# Patient Record
Sex: Female | Born: 1940 | Race: White | Hispanic: No | Marital: Married | Smoking: Never smoker
Health system: Southern US, Community
[De-identification: ages and names within clinical notes are randomized; demographics above are authoritative.]

## PROBLEM LIST (undated history)

## (undated) DIAGNOSIS — L299 Pruritus, unspecified: Secondary | ICD-10-CM

## (undated) DIAGNOSIS — E7800 Pure hypercholesterolemia, unspecified: Secondary | ICD-10-CM

## (undated) DIAGNOSIS — E039 Hypothyroidism, unspecified: Secondary | ICD-10-CM

## (undated) DIAGNOSIS — I10 Essential (primary) hypertension: Secondary | ICD-10-CM

## (undated) HISTORY — DX: Essential (primary) hypertension: I10

## (undated) HISTORY — PX: CATARACT EXTRACTION: SUR2

## (undated) HISTORY — DX: Pruritus, unspecified: L29.9

## (undated) HISTORY — PX: KNEE ARTHROSCOPY: SUR90

## (undated) HISTORY — DX: Pure hypercholesterolemia, unspecified: E78.00

## (undated) HISTORY — DX: Hypothyroidism, unspecified: E03.9

---

## 1998-07-23 ENCOUNTER — Other Ambulatory Visit: Admission: RE | Admit: 1998-07-23 | Discharge: 1998-07-23 | Payer: Self-pay | Admitting: Family Medicine

## 1999-09-30 ENCOUNTER — Other Ambulatory Visit: Admission: RE | Admit: 1999-09-30 | Discharge: 1999-09-30 | Payer: Self-pay | Admitting: Family Medicine

## 2001-02-03 ENCOUNTER — Other Ambulatory Visit: Admission: RE | Admit: 2001-02-03 | Discharge: 2001-02-03 | Payer: Self-pay | Admitting: Family Medicine

## 2002-11-15 ENCOUNTER — Other Ambulatory Visit: Admission: RE | Admit: 2002-11-15 | Discharge: 2002-11-15 | Payer: Self-pay | Admitting: Family Medicine

## 2004-10-22 ENCOUNTER — Other Ambulatory Visit: Admission: RE | Admit: 2004-10-22 | Discharge: 2004-10-22 | Payer: Self-pay | Admitting: Family Medicine

## 2004-10-22 ENCOUNTER — Ambulatory Visit: Payer: Self-pay | Admitting: Family Medicine

## 2004-11-27 ENCOUNTER — Ambulatory Visit: Payer: Self-pay | Admitting: Family Medicine

## 2016-10-06 ENCOUNTER — Ambulatory Visit (INDEPENDENT_AMBULATORY_CARE_PROVIDER_SITE_OTHER): Payer: Medicare Other | Admitting: Podiatry

## 2016-10-06 ENCOUNTER — Ambulatory Visit (INDEPENDENT_AMBULATORY_CARE_PROVIDER_SITE_OTHER): Payer: Medicare Other

## 2016-10-06 ENCOUNTER — Encounter (INDEPENDENT_AMBULATORY_CARE_PROVIDER_SITE_OTHER): Payer: Self-pay

## 2016-10-06 ENCOUNTER — Encounter: Payer: Self-pay | Admitting: Podiatry

## 2016-10-06 VITALS — BP 179/78 | HR 91 | Ht 64.0 in | Wt 176.0 lb

## 2016-10-06 DIAGNOSIS — M722 Plantar fascial fibromatosis: Secondary | ICD-10-CM

## 2016-10-06 MED ORDER — MELOXICAM 15 MG PO TABS: 15.0000 mg | ORAL_TABLET | Freq: Every day | ORAL | 0 refills | Status: DC

## 2016-10-06 NOTE — Progress Notes (Addendum)
   Subjective:    Patient ID: Kimberly Yu, female    DOB: [birth year], 76 y.o.   MRN: [id number] Chief Complaint  Patient presents with  . Foot Pain    left heel pain for 2 mo and a bump in the arch for 2 mo   . Callouses    callus under 3rd toe left     HPI  I have been having pain in my left heel and a lump in my arch for about 2 months.  Patient presents with above complaints. Reports L heel pain x 2 months. Has had this issue before and was treated by another provider. Also noticed some bumps/knots in the arch of her foot and states it increases and decreases in size but does not hurt. Has tried a night splint and plantar fascial strap without relief. Denies other issues.  Review of Systems  Genitourinary: Positive for frequency and urgency.  All other systems reviewed and are negative.     Objective:   Physical Exam  Vitals:   10/06/16 0919  BP: (!) 179/78  Pulse: 91   General AA&O x3. Normal mood and affect.  Vascular Dorsalis pedis and posterior tibial pulses  present 2+ bilaterally  Capillary refill normal to all digits. Pedal hair growth normal.  Neurologic Epicritic sensation grossly present bilaterally.  Dermatologic No open lesions. Interspaces clear of maceration. Nails well groomed and normal in appearance. Hyperkeratotic lesion plantar 3rd met left foot.  Orthopedic: MMT 5/5 in dorsiflexion, plantarflexion, inversion, and eversion bilaterally. Tender to palpation at the calcaneal tuber left. No pain with calcaneal squeeze left. Ankle ROM diminished range of motion left. Silfverskiold Test: positive left. Palpable approx 0.5 cm nodule along medial band of the plantar fascia without pain to palpation.   Radiographs: Taken and reviewed. No acute fractures. No evidence of calcaneal stress fracture.    Assessment & Plan:  Plantar Fasciitis, left - XR reviewed as above.  - Educated on icing and stretching. - Injection consisting of 1cc 0.5 % Marcaine  plain, delivered to the plantar fascia. - Patient reports she has both a night splint and a plantar fascial rest strap. - Educated on proper shoe gear.  Plantar Fibroma, left -Asymptomatic at this time -Will consider injection vs removal if they become symptomatic.  Evelina Bucy DPM

## 2016-11-03 ENCOUNTER — Ambulatory Visit (INDEPENDENT_AMBULATORY_CARE_PROVIDER_SITE_OTHER): Payer: Medicare Other | Admitting: Podiatry

## 2016-11-03 DIAGNOSIS — M722 Plantar fascial fibromatosis: Secondary | ICD-10-CM | POA: Diagnosis not present

## 2016-11-08 NOTE — Progress Notes (Signed)
   Subjective:    Patient ID: Kimberly Yu, female    DOB: [birth year], 76 y.o.   MRN: [id number]  HPI Chief Complaint  Patient presents with  . Plantar Fasciitis    left PF has eased some but not better  pain is bearable    76 y.o. female returns for the above complaint. Reports the pain is somewhat better. Had relief for a few days after her injection. Denies new issues.  Review of Systems     Objective:   Physical Exam  General AA&O x3. Normal mood and affect.  Vascular Dorsalis pedis and posterior tibial pulses  present 2+ bilaterally  Capillary refill normal to all digits. Pedal hair growth normal.  Neurologic Epicritic sensation grossly present bilaterally.  Dermatologic No open lesions. Interspaces clear of maceration. Nails well groomed and normal in appearance. Hyperkeratotic lesion plantar 3rd met left foot.  Orthopedic: MMT 5/5 in dorsiflexion, plantarflexion, inversion, and eversion bilaterally. Tender to palpation at the calcaneal tuber left. No pain with calcaneal squeeze left. Ankle ROM diminished range of motion left. Silfverskiold Test: positive left. Palpable approx 0.5 cm nodule along medial band of the plantar fascia without pain to palpation.     Assessment & Plan:   Plantar Fasciitis, L -Repeat injection today as below. -Continue night splint.  Procedure: Injection Tendon/Ligament Location: Left plantar fascia at the glabrous junction; medial approach. Skin Prep: Alcohol. Injectate: 1 cc 0.5% marcaine plain, 1 cc dexamethasone phosphate, 0.5 cc kenalog 10. Disposition: Patient tolerated procedure well. Injection site dressed with a band-aid.  F/u in 3 weeks.

## 2016-12-07 ENCOUNTER — Ambulatory Visit (INDEPENDENT_AMBULATORY_CARE_PROVIDER_SITE_OTHER): Payer: Medicare Other | Admitting: Allergy and Immunology

## 2016-12-07 ENCOUNTER — Encounter: Payer: Self-pay | Admitting: Allergy and Immunology

## 2016-12-07 VITALS — BP 160/90 | HR 72 | Temp 98.1°F | Resp 16 | Ht 63.6 in | Wt 175.6 lb

## 2016-12-07 DIAGNOSIS — L503 Dermatographic urticaria: Secondary | ICD-10-CM

## 2016-12-07 DIAGNOSIS — L299 Pruritus, unspecified: Secondary | ICD-10-CM

## 2016-12-07 NOTE — Patient Instructions (Addendum)
  1. Allergen avoidance measures?  2. Try loratadine 10 mg tablet 1-2 times per day  3. Can add Benadryl if needed  4. Blood - CBC w/diff, TP, sed, antimitochondrial antibody  5. Further evaluation? Yes, if uncontrolled or new symptoms develop  6. Contact clinic regarding loratadine response in 2 weeks

## 2016-12-07 NOTE — Progress Notes (Signed)
Dear Dr. Marcello Moores,  Thank you for referring Kimberly Yu to the [facility] on 12/07/2016.   Below is a summation of this patient's evaluation and recommendations.  Thank you for your referral. I will keep you informed about this patient's response to treatment.   If you have any questions please do not hesitate to contact me.   Sincerely,  Jiles Prows, MD Allergy / Immunology [facility]   ______________________________________________________________________    NEW PATIENT NOTE  Referring Provider: Melony Overly, MD   Primary Provider: Melony Overly, MD Date of office visit: 12/07/2016    Subjective:   Chief Complaint:  Kimberly Yu (DOB: 02-14-1940) is a 76 y.o. female who presents to the clinic on 12/07/2016 with a chief complaint of Pruritus .     HPI: Kimberly Yu presents this clinic in evaluation of pruritus.  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 She has no other associated systemic or constitutional symptoms. There has been no obvious provoking factor giving rise to this issue. She has not had a significant environmental change, is not using any new diets or supplements or health foods, is not on any new medications, and feels the same that she has felt over the course of the past year or so without any unusual developments. Screening blood tests performed by her primary care doctor has not identified any significant abnormality. She has been using Benadryl at nighttime which helps her somewhat and also uses over-the-counter hydrocortisone cream which may or may not help her.  Past Medical History:  Diagnosis Date  . High blood pressure   . High cholesterol   . Hypothyroidism   . Pruritus     Past Surgical History:    Procedure Laterality Date  . CATARACT EXTRACTION    . KNEE ARTHROSCOPY Right     Allergies as of 12/07/2016   No Known Allergies     Medication List      hydrochlorothiazide 25 MG tablet Commonly known as:  HYDRODIURIL Take 25 mg by mouth daily.   levothyroxine 50 MCG tablet Commonly known as:  SYNTHROID, LEVOTHROID Take 50 mcg by mouth daily before breakfast.   MULTIVITAMIN PO Take by mouth.   omeprazole 20 MG capsule Commonly known as:  PRILOSEC Take 20 mg by mouth daily.   pravastatin 40 MG tablet Commonly known as:  PRAVACHOL Take 40 mg by mouth daily.   ramipril 10 MG capsule Commonly known as:  ALTACE Take 10 mg by mouth daily.   Vitamin D3 1000 units Caps Take by mouth.       Review of systems negative except as noted in HPI / PMHx or noted below:  Review of Systems  Constitutional: Negative.   HENT: Negative.   Eyes: Negative.   Respiratory: Negative.   Cardiovascular: Negative.   Gastrointestinal: Negative.   Genitourinary: Negative.   Musculoskeletal: Negative.   Skin: Negative.   Neurological: Negative.   Endo/Heme/Allergies: Negative.   Psychiatric/Behavioral: Negative.     Family History  Problem Relation Age of Onset  . Stroke Mother   . Lung cancer Father   . Heart attack Father   . Diabetes Father   . Stroke Maternal Grandfather     Social History   Social History  . Marital status:  Married    Spouse name: N/A  . Number of children: N/A  . Years of education: N/A   Occupational History  . Not on file.   Social History Main Topics  . Smoking status: Never Smoker  . Smokeless tobacco: Never Used  . Alcohol use No  . Drug use: No  . Sexual activity: Not on file   Other Topics Concern  . Not on file   Social History Narrative  . No narrative on file    Environmental and Social history  Lives in a house with a dry environment, a dog located inside the household, no carpeting in the bedroom, plastic on the bed,  plastic on the pillow, and no smokers located inside the household.  Objective:   Vitals:   12/07/16 0856  BP: (!) 160/90  Pulse: 72  Resp: 16  Temp: 98.1 F (36.7 C)   Height: 5' 3.6" (161.5 cm) Weight: 175 lb 9.6 oz (79.7 kg)  Physical Exam  Constitutional: She is well-developed, well-nourished, and in no distress.  HENT:  Head: Normocephalic. Head is without right periorbital erythema and without left periorbital erythema.  Right Ear: Tympanic membrane, external ear and ear canal normal.  Left Ear: Tympanic membrane, external ear and ear canal normal.  Nose: Nose normal. No mucosal edema or rhinorrhea.  Mouth/Throat: Uvula is midline, oropharynx is clear and moist and mucous membranes are normal. No oropharyngeal exudate.  Eyes: Pupils are equal, round, and reactive to light. Conjunctivae and lids are normal.  Neck: Trachea normal. No tracheal tenderness present. No tracheal deviation present. No thyromegaly present.  Cardiovascular: Normal rate, regular rhythm, S1 normal, S2 normal and normal heart sounds.   No murmur heard. Pulmonary/Chest: Effort normal and breath sounds normal. No stridor. No tachypnea. No respiratory distress. She has no wheezes. She has no rales. She exhibits no tenderness.  Abdominal: Soft. She exhibits no distension and no mass. There is no hepatosplenomegaly. There is no tenderness. There is no rebound and no guarding.  Musculoskeletal: She exhibits no edema or tenderness.  Lymphadenopathy:       Head (right side): No tonsillar adenopathy present.       Head (left side): No tonsillar adenopathy present.    She has no cervical adenopathy.    She has no axillary adenopathy.  Neurological: She is alert. Gait normal.  Skin: No rash noted. She is not diaphoretic. No erythema. No pallor. Nails show no clubbing.  Psychiatric: Mood and affect normal.    Diagnostics: Allergy skin tests were performed. She did not demonstrate any hypersensitivity against a  screening panel of foods.  Results of blood tests forwarded by Dr. Marcello Moores dated 11/17/2016 refers to normal hepatic and renal function, normal TSH, normal urinalysis.  Assessment and Plan:    1. Pruritic disorder   2. Dermatographia     1. Allergen avoidance measures?  2. Try loratadine 10 mg tablet 1-2 times per day  3. Can add Benadryl if needed  4. Blood - CBC w/diff, TP, sed, antimitochondrial antibody  5. Further evaluation? Yes, if uncontrolled or new symptoms develop  6. 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 She will contact me in a few weeks noting her  response and I will contact her regarding her blood test results. Further evaluation and treatment will be based upon response to therapy and the results of her diagnostic testing.  Jiles Prows, MD Allergy / Immunology [facility]

## 2016-12-09 LAB — CBC WITH DIFFERENTIAL/PLATELET
Basophils Absolute: 0.1 10*3/uL (ref 0.0–0.2)
Basos: 1 %
EOS (ABSOLUTE): 0.1 10*3/uL (ref 0.0–0.4)
EOS: 2 %
Hematocrit: 38.2 % (ref 34.0–46.6)
Hemoglobin: 13.1 g/dL (ref 11.1–15.9)
IMMATURE GRANS (ABS): 0 10*3/uL (ref 0.0–0.1)
IMMATURE GRANULOCYTES: 0 %
LYMPHS: 26 %
Lymphocytes Absolute: 1.6 10*3/uL (ref 0.7–3.1)
MCH: 30.5 pg (ref 26.6–33.0)
MCHC: 34.3 g/dL (ref 31.5–35.7)
MCV: 89 fL (ref 79–97)
Monocytes Absolute: 0.4 10*3/uL (ref 0.1–0.9)
Monocytes: 6 %
NEUTROS PCT: 65 %
Neutrophils Absolute: 4.2 10*3/uL (ref 1.4–7.0)
PLATELETS: 380 10*3/uL — AB (ref 150–379)
RBC: 4.29 x10E6/uL (ref 3.77–5.28)
RDW: 13.2 % (ref 12.3–15.4)
WBC: 6.4 10*3/uL (ref 3.4–10.8)

## 2016-12-09 LAB — SEDIMENTATION RATE: SED RATE: 14 mm/h (ref 0–40)

## 2016-12-09 LAB — MITOCHONDRIAL ANTIBODIES: MITOCHONDRIAL AB: 3.9 U (ref 0.0–20.0)

## 2016-12-09 LAB — THYROID PEROXIDASE ANTIBODY: Thyroperoxidase Ab SerPl-aCnc: 9 IU/mL (ref 0–34)

## 2016-12-22 ENCOUNTER — Ambulatory Visit: Payer: Medicare Other | Admitting: Podiatry

## 2017-09-07 ENCOUNTER — Other Ambulatory Visit: Payer: Self-pay | Admitting: Neurosurgery

## 2017-09-07 DIAGNOSIS — M5412 Radiculopathy, cervical region: Secondary | ICD-10-CM

## 2017-09-13 ENCOUNTER — Ambulatory Visit
Admission: RE | Admit: 2017-09-13 | Discharge: 2017-09-13 | Disposition: A | Discharge status: Home / Self Care | Payer: Medicare Other | Source: Ambulatory Visit | Attending: Neurosurgery | Admitting: Neurosurgery

## 2017-09-13 DIAGNOSIS — M5412 Radiculopathy, cervical region: Secondary | ICD-10-CM

## 2018-07-29 ENCOUNTER — Other Ambulatory Visit: Payer: Self-pay | Admitting: Internal Medicine

## 2018-07-29 DIAGNOSIS — R921 Mammographic calcification found on diagnostic imaging of breast: Secondary | ICD-10-CM

## 2018-07-29 DIAGNOSIS — N63 Unspecified lump in unspecified breast: Secondary | ICD-10-CM

## 2018-08-02 ENCOUNTER — Other Ambulatory Visit: Payer: Self-pay | Admitting: Internal Medicine

## 2018-08-02 DIAGNOSIS — R921 Mammographic calcification found on diagnostic imaging of breast: Secondary | ICD-10-CM

## 2018-08-05 ENCOUNTER — Ambulatory Visit
Admission: RE | Admit: 2018-08-05 | Discharge: 2018-08-05 | Disposition: A | Discharge status: Home / Self Care | Payer: Medicare Other | Source: Ambulatory Visit | Attending: Internal Medicine | Admitting: Internal Medicine

## 2018-08-05 ENCOUNTER — Other Ambulatory Visit: Payer: Self-pay

## 2018-08-05 DIAGNOSIS — N63 Unspecified lump in unspecified breast: Secondary | ICD-10-CM

## 2018-08-05 DIAGNOSIS — R921 Mammographic calcification found on diagnostic imaging of breast: Secondary | ICD-10-CM

## 2018-08-16 DIAGNOSIS — Z170 Estrogen receptor positive status [ER+]: Secondary | ICD-10-CM | POA: Diagnosis not present

## 2018-08-16 DIAGNOSIS — C50412 Malignant neoplasm of upper-outer quadrant of left female breast: Secondary | ICD-10-CM | POA: Diagnosis not present

## 2018-09-12 DIAGNOSIS — C50412 Malignant neoplasm of upper-outer quadrant of left female breast: Secondary | ICD-10-CM | POA: Diagnosis not present

## 2018-09-14 DIAGNOSIS — I351 Nonrheumatic aortic (valve) insufficiency: Secondary | ICD-10-CM

## 2018-09-14 DIAGNOSIS — I340 Nonrheumatic mitral (valve) insufficiency: Secondary | ICD-10-CM

## 2018-10-06 DIAGNOSIS — C50412 Malignant neoplasm of upper-outer quadrant of left female breast: Secondary | ICD-10-CM | POA: Diagnosis not present

## 2018-10-27 DIAGNOSIS — C50412 Malignant neoplasm of upper-outer quadrant of left female breast: Secondary | ICD-10-CM | POA: Diagnosis not present

## 2018-11-03 ENCOUNTER — Other Ambulatory Visit (HOSPITAL_COMMUNITY): Payer: Self-pay | Admitting: Oncology

## 2018-11-03 DIAGNOSIS — Z09 Encounter for follow-up examination after completed treatment for conditions other than malignant neoplasm: Secondary | ICD-10-CM

## 2018-11-11 DIAGNOSIS — D649 Anemia, unspecified: Secondary | ICD-10-CM

## 2018-11-11 DIAGNOSIS — R7881 Bacteremia: Secondary | ICD-10-CM

## 2018-11-11 DIAGNOSIS — A419 Sepsis, unspecified organism: Secondary | ICD-10-CM

## 2018-11-11 DIAGNOSIS — I10 Essential (primary) hypertension: Secondary | ICD-10-CM

## 2018-11-11 DIAGNOSIS — N390 Urinary tract infection, site not specified: Secondary | ICD-10-CM | POA: Diagnosis not present

## 2018-11-11 DIAGNOSIS — E785 Hyperlipidemia, unspecified: Secondary | ICD-10-CM

## 2018-11-12 DIAGNOSIS — R7881 Bacteremia: Secondary | ICD-10-CM | POA: Diagnosis not present

## 2018-11-12 DIAGNOSIS — D649 Anemia, unspecified: Secondary | ICD-10-CM | POA: Diagnosis not present

## 2018-11-12 DIAGNOSIS — A419 Sepsis, unspecified organism: Secondary | ICD-10-CM | POA: Diagnosis not present

## 2018-11-12 DIAGNOSIS — N390 Urinary tract infection, site not specified: Secondary | ICD-10-CM | POA: Diagnosis not present

## 2018-11-13 DIAGNOSIS — D649 Anemia, unspecified: Secondary | ICD-10-CM | POA: Diagnosis not present

## 2018-11-13 DIAGNOSIS — N390 Urinary tract infection, site not specified: Secondary | ICD-10-CM | POA: Diagnosis not present

## 2018-11-13 DIAGNOSIS — R7881 Bacteremia: Secondary | ICD-10-CM | POA: Diagnosis not present

## 2018-11-13 DIAGNOSIS — A419 Sepsis, unspecified organism: Secondary | ICD-10-CM | POA: Diagnosis not present

## 2018-11-14 DIAGNOSIS — R7881 Bacteremia: Secondary | ICD-10-CM | POA: Diagnosis not present

## 2018-11-14 DIAGNOSIS — N390 Urinary tract infection, site not specified: Secondary | ICD-10-CM | POA: Diagnosis not present

## 2018-11-14 DIAGNOSIS — D649 Anemia, unspecified: Secondary | ICD-10-CM | POA: Diagnosis not present

## 2018-11-14 DIAGNOSIS — A419 Sepsis, unspecified organism: Secondary | ICD-10-CM | POA: Diagnosis not present

## 2018-11-24 DIAGNOSIS — Z170 Estrogen receptor positive status [ER+]: Secondary | ICD-10-CM | POA: Diagnosis not present

## 2018-11-24 DIAGNOSIS — C50412 Malignant neoplasm of upper-outer quadrant of left female breast: Secondary | ICD-10-CM

## 2018-12-07 ENCOUNTER — Other Ambulatory Visit (HOSPITAL_COMMUNITY): Payer: Medicare Other

## 2018-12-22 DIAGNOSIS — C50412 Malignant neoplasm of upper-outer quadrant of left female breast: Secondary | ICD-10-CM | POA: Diagnosis not present

## 2018-12-22 DIAGNOSIS — Z170 Estrogen receptor positive status [ER+]: Secondary | ICD-10-CM | POA: Diagnosis not present

## 2019-04-06 DIAGNOSIS — Z170 Estrogen receptor positive status [ER+]: Secondary | ICD-10-CM | POA: Diagnosis not present

## 2019-04-06 DIAGNOSIS — Z79810 Long term (current) use of selective estrogen receptor modulators (SERMs): Secondary | ICD-10-CM | POA: Diagnosis not present

## 2019-04-06 DIAGNOSIS — C50919 Malignant neoplasm of unspecified site of unspecified female breast: Secondary | ICD-10-CM

## 2019-06-28 DIAGNOSIS — C50412 Malignant neoplasm of upper-outer quadrant of left female breast: Secondary | ICD-10-CM | POA: Diagnosis not present

## 2019-06-28 DIAGNOSIS — I361 Nonrheumatic tricuspid (valve) insufficiency: Secondary | ICD-10-CM | POA: Diagnosis not present

## 2019-06-28 DIAGNOSIS — I351 Nonrheumatic aortic (valve) insufficiency: Secondary | ICD-10-CM | POA: Diagnosis not present

## 2019-06-28 DIAGNOSIS — I340 Nonrheumatic mitral (valve) insufficiency: Secondary | ICD-10-CM | POA: Diagnosis not present

## 2019-06-29 DIAGNOSIS — C50412 Malignant neoplasm of upper-outer quadrant of left female breast: Secondary | ICD-10-CM | POA: Diagnosis not present

## 2019-07-03 DIAGNOSIS — C50412 Malignant neoplasm of upper-outer quadrant of left female breast: Secondary | ICD-10-CM | POA: Diagnosis not present

## 2019-07-03 DIAGNOSIS — Z853 Personal history of malignant neoplasm of breast: Secondary | ICD-10-CM | POA: Diagnosis not present

## 2019-07-03 DIAGNOSIS — R928 Other abnormal and inconclusive findings on diagnostic imaging of breast: Secondary | ICD-10-CM | POA: Diagnosis not present

## 2019-07-07 DIAGNOSIS — C50412 Malignant neoplasm of upper-outer quadrant of left female breast: Secondary | ICD-10-CM | POA: Diagnosis not present

## 2019-07-07 DIAGNOSIS — Z170 Estrogen receptor positive status [ER+]: Secondary | ICD-10-CM | POA: Diagnosis not present

## 2019-07-20 DIAGNOSIS — C50412 Malignant neoplasm of upper-outer quadrant of left female breast: Secondary | ICD-10-CM | POA: Diagnosis not present

## 2019-07-27 DIAGNOSIS — Z9181 History of falling: Secondary | ICD-10-CM | POA: Diagnosis not present

## 2019-07-27 DIAGNOSIS — Z1331 Encounter for screening for depression: Secondary | ICD-10-CM | POA: Diagnosis not present

## 2019-07-27 DIAGNOSIS — Z0000 Encounter for general adult medical examination without abnormal findings: Secondary | ICD-10-CM | POA: Diagnosis not present

## 2019-07-27 DIAGNOSIS — E785 Hyperlipidemia, unspecified: Secondary | ICD-10-CM | POA: Diagnosis not present

## 2019-07-27 DIAGNOSIS — Z139 Encounter for screening, unspecified: Secondary | ICD-10-CM | POA: Diagnosis not present

## 2019-08-10 DIAGNOSIS — Z5111 Encounter for antineoplastic chemotherapy: Secondary | ICD-10-CM | POA: Diagnosis not present

## 2019-08-10 DIAGNOSIS — C50412 Malignant neoplasm of upper-outer quadrant of left female breast: Secondary | ICD-10-CM | POA: Diagnosis not present

## 2019-08-31 DIAGNOSIS — Z5111 Encounter for antineoplastic chemotherapy: Secondary | ICD-10-CM | POA: Diagnosis not present

## 2019-08-31 DIAGNOSIS — C50412 Malignant neoplasm of upper-outer quadrant of left female breast: Secondary | ICD-10-CM | POA: Diagnosis not present

## 2019-09-04 DIAGNOSIS — I351 Nonrheumatic aortic (valve) insufficiency: Secondary | ICD-10-CM | POA: Diagnosis not present

## 2019-09-04 DIAGNOSIS — E039 Hypothyroidism, unspecified: Secondary | ICD-10-CM | POA: Diagnosis not present

## 2019-09-04 DIAGNOSIS — E785 Hyperlipidemia, unspecified: Secondary | ICD-10-CM | POA: Diagnosis not present

## 2019-09-04 DIAGNOSIS — E1122 Type 2 diabetes mellitus with diabetic chronic kidney disease: Secondary | ICD-10-CM | POA: Diagnosis not present

## 2019-09-04 DIAGNOSIS — M791 Myalgia, unspecified site: Secondary | ICD-10-CM | POA: Diagnosis not present

## 2019-09-04 DIAGNOSIS — G629 Polyneuropathy, unspecified: Secondary | ICD-10-CM | POA: Diagnosis not present

## 2019-09-04 DIAGNOSIS — Z6828 Body mass index (BMI) 28.0-28.9, adult: Secondary | ICD-10-CM | POA: Diagnosis not present

## 2019-09-04 DIAGNOSIS — I10 Essential (primary) hypertension: Secondary | ICD-10-CM | POA: Diagnosis not present

## 2019-09-04 DIAGNOSIS — N182 Chronic kidney disease, stage 2 (mild): Secondary | ICD-10-CM | POA: Diagnosis not present

## 2019-09-04 DIAGNOSIS — K219 Gastro-esophageal reflux disease without esophagitis: Secondary | ICD-10-CM | POA: Diagnosis not present

## 2019-09-06 DIAGNOSIS — H353131 Nonexudative age-related macular degeneration, bilateral, early dry stage: Secondary | ICD-10-CM | POA: Diagnosis not present

## 2019-09-25 DIAGNOSIS — M1712 Unilateral primary osteoarthritis, left knee: Secondary | ICD-10-CM | POA: Diagnosis not present

## 2019-09-25 DIAGNOSIS — M25562 Pain in left knee: Secondary | ICD-10-CM | POA: Diagnosis not present

## 2019-09-25 DIAGNOSIS — M25561 Pain in right knee: Secondary | ICD-10-CM | POA: Diagnosis not present

## 2019-10-02 ENCOUNTER — Ambulatory Visit: Payer: Medicare Other | Admitting: Podiatry

## 2019-10-05 DIAGNOSIS — H9203 Otalgia, bilateral: Secondary | ICD-10-CM | POA: Diagnosis not present

## 2019-10-24 DIAGNOSIS — M1711 Unilateral primary osteoarthritis, right knee: Secondary | ICD-10-CM | POA: Diagnosis not present

## 2019-10-25 DIAGNOSIS — M25562 Pain in left knee: Secondary | ICD-10-CM | POA: Diagnosis not present

## 2019-10-26 DIAGNOSIS — H9319 Tinnitus, unspecified ear: Secondary | ICD-10-CM | POA: Diagnosis not present

## 2019-10-26 DIAGNOSIS — J342 Deviated nasal septum: Secondary | ICD-10-CM | POA: Diagnosis not present

## 2019-10-26 DIAGNOSIS — H9203 Otalgia, bilateral: Secondary | ICD-10-CM | POA: Diagnosis not present

## 2019-10-26 DIAGNOSIS — H9190 Unspecified hearing loss, unspecified ear: Secondary | ICD-10-CM | POA: Diagnosis not present

## 2019-10-26 DIAGNOSIS — J343 Hypertrophy of nasal turbinates: Secondary | ICD-10-CM | POA: Diagnosis not present

## 2019-11-01 DIAGNOSIS — S82132A Displaced fracture of medial condyle of left tibia, initial encounter for closed fracture: Secondary | ICD-10-CM | POA: Diagnosis not present

## 2019-11-01 DIAGNOSIS — M1712 Unilateral primary osteoarthritis, left knee: Secondary | ICD-10-CM | POA: Diagnosis not present

## 2019-11-01 DIAGNOSIS — S83242S Other tear of medial meniscus, current injury, left knee, sequela: Secondary | ICD-10-CM | POA: Diagnosis not present

## 2019-11-09 DIAGNOSIS — J342 Deviated nasal septum: Secondary | ICD-10-CM | POA: Diagnosis not present

## 2019-11-09 DIAGNOSIS — H9319 Tinnitus, unspecified ear: Secondary | ICD-10-CM | POA: Diagnosis not present

## 2019-11-09 DIAGNOSIS — H903 Sensorineural hearing loss, bilateral: Secondary | ICD-10-CM | POA: Diagnosis not present

## 2019-12-19 ENCOUNTER — Other Ambulatory Visit: Payer: Self-pay | Admitting: Oncology

## 2019-12-20 ENCOUNTER — Telehealth: Payer: Self-pay

## 2019-12-20 ENCOUNTER — Other Ambulatory Visit: Payer: Self-pay | Admitting: Oncology

## 2019-12-20 MED ORDER — ANASTROZOLE 1 MG PO TABS: 1.0000 mg | ORAL_TABLET | Freq: Every day | ORAL | 3 refills | Status: DC

## 2019-12-21 NOTE — Telephone Encounter (Signed)
MD. REFILLED MEDICATION

## 2019-12-31 NOTE — Progress Notes (Signed)
Kimberly Yu  [address] [place],  [place]  [address] [phone]  Clinic Day:  01/01/2020  Referring physician: Nicoletta Dress, MD   HISTORY OF PRESENT ILLNESS:  The patient is a 79 y.o. 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  She recently completed her year-long maintenance Herceptin.  This was preceded by weekly paclitaxel/Herceptin.  The patient claims her neuropathy has gotten much better over the past few months, which she attributes to taking vitamin D.  She takes anastrazole for her adjuvant endocrine endocrine therapy.   She comes in today for routine follow-up.  Since her last visit, the patient has been doing well.  She denies having any particular changes in her breasts which concern her for early disease recurrence.     PHYSICAL EXAM:  Blood pressure (!) 174/77, pulse 88, temperature 98.1 F (36.7 C), resp. rate 14, height 5\' 3"  (1.6 m), weight 163 lb 1.6 oz (74 kg), SpO2 95 %. Wt Readings from Last 3 Encounters:  01/01/20 163 lb 1.6 oz (74 kg)  12/07/16 175 lb 9.6 oz (79.7 kg)  10/06/16 176 lb (79.8 kg)   Body mass index is 28.89 kg/m. Performance status (ECOG): 1 Physical Exam Constitutional:      Appearance: Normal appearance.  HENT:     Mouth/Throat:     Pharynx: Oropharynx is clear. No oropharyngeal exudate.  Cardiovascular:     Rate and Rhythm: Normal rate and regular rhythm.     Heart sounds: No murmur heard.  No friction rub. No gallop.   Pulmonary:     Breath sounds: Normal breath sounds.  Chest:     Breasts:        Right: No swelling, bleeding, inverted nipple, mass, nipple discharge or skin change.        Left: No swelling, bleeding, inverted nipple, mass, nipple discharge or skin change.  Abdominal:     General: Bowel sounds are normal. There is no distension.     Palpations: Abdomen is soft. There is no mass.     Tenderness: There is no abdominal tenderness.   Musculoskeletal:        General: No tenderness.     Cervical back: Normal range of motion and neck supple.     Right lower leg: No edema.     Left lower leg: No edema.  Lymphadenopathy:     Cervical: No cervical adenopathy.     Right cervical: No superficial, deep or posterior cervical adenopathy.    Left cervical: No superficial, deep or posterior cervical adenopathy.     Upper Body:     Right upper body: No supraclavicular or axillary adenopathy.     Left upper body: No supraclavicular or axillary adenopathy.     Lower Body: No right inguinal adenopathy. No left inguinal adenopathy.  Skin:    Coloration: Skin is not jaundiced.     Findings: No lesion or rash.  Neurological:     General: No focal deficit present.     Mental Status: She is alert and oriented to person, place, and time. Mental status is at baseline.  Psychiatric:        Mood and Affect: Mood normal.        Behavior: Behavior normal.        Thought Content: Thought content normal.        Judgment: Judgment normal.     ASSESSMENT & PLAN:   Assessment/Plan:  A  79 y.o. 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  Based upon her clinical breast exam today, the patient remains disease-free.  She knows to continue taking anastrozole 1 mg daily to complete 5 years of adjuvant endocrine therapy.  Clinically, the patient appears to be doing very well.  I will see this patient back in 4 months for a repeat clinical breast exam.  The patient understands all the plans discussed today and is in agreement with them.      Atha Mcbain Macarthur Critchley, MD

## 2020-01-01 ENCOUNTER — Telehealth: Payer: Self-pay | Admitting: Oncology

## 2020-01-01 ENCOUNTER — Other Ambulatory Visit: Payer: Self-pay

## 2020-01-01 ENCOUNTER — Inpatient Hospital Stay: Payer: Medicare PPO | Attending: Oncology | Admitting: Oncology

## 2020-01-01 VITALS — BP 174/77 | HR 88 | Temp 98.1°F | Resp 14 | Ht 63.0 in | Wt 163.1 lb

## 2020-01-01 DIAGNOSIS — C50212 Malignant neoplasm of upper-inner quadrant of left female breast: Secondary | ICD-10-CM

## 2020-01-01 DIAGNOSIS — Z170 Estrogen receptor positive status [ER+]: Secondary | ICD-10-CM | POA: Diagnosis not present

## 2020-01-01 NOTE — Telephone Encounter (Signed)
Per 11/22 LOS, scheduled patient for 04/30/20 Follow up with Dr Bobby Rumpf [facility] patient Appt Summary

## 2020-01-05 DIAGNOSIS — K219 Gastro-esophageal reflux disease without esophagitis: Secondary | ICD-10-CM | POA: Diagnosis not present

## 2020-01-05 DIAGNOSIS — E1122 Type 2 diabetes mellitus with diabetic chronic kidney disease: Secondary | ICD-10-CM | POA: Diagnosis not present

## 2020-01-05 DIAGNOSIS — I10 Essential (primary) hypertension: Secondary | ICD-10-CM | POA: Diagnosis not present

## 2020-01-05 DIAGNOSIS — E785 Hyperlipidemia, unspecified: Secondary | ICD-10-CM | POA: Diagnosis not present

## 2020-01-05 DIAGNOSIS — I351 Nonrheumatic aortic (valve) insufficiency: Secondary | ICD-10-CM | POA: Diagnosis not present

## 2020-01-05 DIAGNOSIS — N182 Chronic kidney disease, stage 2 (mild): Secondary | ICD-10-CM | POA: Diagnosis not present

## 2020-01-05 DIAGNOSIS — M791 Myalgia, unspecified site: Secondary | ICD-10-CM | POA: Diagnosis not present

## 2020-01-05 DIAGNOSIS — E039 Hypothyroidism, unspecified: Secondary | ICD-10-CM | POA: Diagnosis not present

## 2020-01-05 DIAGNOSIS — Z853 Personal history of malignant neoplasm of breast: Secondary | ICD-10-CM | POA: Diagnosis not present

## 2020-03-05 DIAGNOSIS — M546 Pain in thoracic spine: Secondary | ICD-10-CM | POA: Diagnosis not present

## 2020-04-08 DIAGNOSIS — Z9109 Other allergy status, other than to drugs and biological substances: Secondary | ICD-10-CM | POA: Diagnosis not present

## 2020-04-08 DIAGNOSIS — E785 Hyperlipidemia, unspecified: Secondary | ICD-10-CM | POA: Diagnosis not present

## 2020-04-08 DIAGNOSIS — E039 Hypothyroidism, unspecified: Secondary | ICD-10-CM | POA: Diagnosis not present

## 2020-04-08 DIAGNOSIS — N182 Chronic kidney disease, stage 2 (mild): Secondary | ICD-10-CM | POA: Diagnosis not present

## 2020-04-08 DIAGNOSIS — K219 Gastro-esophageal reflux disease without esophagitis: Secondary | ICD-10-CM | POA: Diagnosis not present

## 2020-04-08 DIAGNOSIS — Z853 Personal history of malignant neoplasm of breast: Secondary | ICD-10-CM | POA: Diagnosis not present

## 2020-04-08 DIAGNOSIS — I10 Essential (primary) hypertension: Secondary | ICD-10-CM | POA: Diagnosis not present

## 2020-04-08 DIAGNOSIS — I351 Nonrheumatic aortic (valve) insufficiency: Secondary | ICD-10-CM | POA: Diagnosis not present

## 2020-04-08 DIAGNOSIS — E1122 Type 2 diabetes mellitus with diabetic chronic kidney disease: Secondary | ICD-10-CM | POA: Diagnosis not present

## 2020-04-22 DIAGNOSIS — C50412 Malignant neoplasm of upper-outer quadrant of left female breast: Secondary | ICD-10-CM | POA: Insufficient documentation

## 2020-04-29 NOTE — Progress Notes (Signed)
[facility]  [address] [place],  [place]  [address] [phone]  Clinic Day:  04/30/2020  Referring physician: Nicoletta Dress, MD   HISTORY OF PRESENT ILLNESS:  The patient is a 80 y.o. 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  She completed her adjuvant chemotherapy, which consisted of weekly paclitaxel/Herceptin, followed by year-long maintenance Herceptin.  She continues to take anastrazole for her adjuvant endocrine endocrine therapy.   She comes in today for routine follow-up.  Since her last visit, the patient has been doing well.  She denies having any particular changes in her breasts which concern her for early disease recurrence.    PHYSICAL EXAM:  Blood pressure (!) 182/79, pulse 75, temperature 98.3 F (36.8 C), resp. rate 16, height 5\' 3"  (1.6 m), weight 168 lb 8 oz (76.4 kg), SpO2 97 %. Wt Readings from Last 3 Encounters:  04/30/20 168 lb 8 oz (76.4 kg)  01/01/20 163 lb 1.6 oz (74 kg)  12/07/16 175 lb 9.6 oz (79.7 kg)   Body mass index is 29.85 kg/m. Performance status (ECOG): 1 Physical Exam Constitutional:      Appearance: Normal appearance.  HENT:     Mouth/Throat:     Pharynx: Oropharynx is clear. No oropharyngeal exudate.  Cardiovascular:     Rate and Rhythm: Normal rate and regular rhythm.     Heart sounds: No murmur heard. No friction rub. No gallop.   Pulmonary:     Breath sounds: Normal breath sounds.  Chest:  Breasts:     Right: No swelling, bleeding, inverted nipple, mass, nipple discharge, skin change, axillary adenopathy or supraclavicular adenopathy.     Left: No swelling, bleeding, inverted nipple, mass, nipple discharge, skin change, axillary adenopathy or supraclavicular adenopathy.    Abdominal:     General: Bowel sounds are normal. There is no distension.     Palpations: Abdomen is soft. There is no mass.      Tenderness: There is no abdominal tenderness.  Musculoskeletal:        General: No tenderness.     Cervical back: Normal range of motion and neck supple.     Right lower leg: No edema.     Left lower leg: No edema.  Lymphadenopathy:     Cervical: No cervical adenopathy.     Right cervical: No superficial, deep or posterior cervical adenopathy.    Left cervical: No superficial, deep or posterior cervical adenopathy.     Upper Body:     Right upper body: No supraclavicular or axillary adenopathy.     Left upper body: No supraclavicular or axillary adenopathy.     Lower Body: No right inguinal adenopathy. No left inguinal adenopathy.  Skin:    Coloration: Skin is not jaundiced.     Findings: No lesion or rash.  Neurological:     General: No focal deficit present.     Mental Status: She is alert and oriented to person, place, and time. Mental status is at baseline.  Psychiatric:        Mood and Affect: Mood normal.        Behavior: Behavior normal.        Thought Content: Thought content normal.        Judgment: Judgment normal.     ASSESSMENT & PLAN:  Assessment/Plan:  A 80 y.o. female with stage IA (T1b N0 M0) hormone/her  2 Neu receptor positive breast cancer.  Based upon her clinical breast exam today, the patient remains disease-free.  She knows to continue taking anastrozole 1 mg daily to complete 5 years of adjuvant endocrine therapy.  Clinically, the patient appears to be doing very well.  I will see this patient back in 4 months for a repeat clinical breast exam.  Her annual mammogram will be scheduled before her next visit for her continued radiographic breast cancer surveillance.  If her next mammogram and clinical breast exam are normal, I will begin spacing all future visits out to every 6 months.  The patient understands all the plans discussed today and is in agreement with them.    Emoree Sasaki Macarthur Critchley, MD

## 2020-04-30 ENCOUNTER — Inpatient Hospital Stay: Payer: Medicare PPO | Attending: Oncology | Admitting: Oncology

## 2020-04-30 ENCOUNTER — Other Ambulatory Visit: Payer: Self-pay

## 2020-04-30 DIAGNOSIS — C50412 Malignant neoplasm of upper-outer quadrant of left female breast: Secondary | ICD-10-CM | POA: Diagnosis not present

## 2020-05-07 DIAGNOSIS — R350 Frequency of micturition: Secondary | ICD-10-CM | POA: Diagnosis not present

## 2020-05-29 DIAGNOSIS — R1084 Generalized abdominal pain: Secondary | ICD-10-CM | POA: Diagnosis not present

## 2020-05-29 DIAGNOSIS — N3091 Cystitis, unspecified with hematuria: Secondary | ICD-10-CM | POA: Diagnosis not present

## 2020-05-29 DIAGNOSIS — M549 Dorsalgia, unspecified: Secondary | ICD-10-CM | POA: Diagnosis not present

## 2020-05-29 DIAGNOSIS — R300 Dysuria: Secondary | ICD-10-CM | POA: Diagnosis not present

## 2020-05-29 DIAGNOSIS — N3001 Acute cystitis with hematuria: Secondary | ICD-10-CM | POA: Diagnosis not present

## 2020-07-03 DIAGNOSIS — C50412 Malignant neoplasm of upper-outer quadrant of left female breast: Secondary | ICD-10-CM | POA: Diagnosis not present

## 2020-07-03 DIAGNOSIS — R922 Inconclusive mammogram: Secondary | ICD-10-CM | POA: Diagnosis not present

## 2020-07-03 DIAGNOSIS — Z170 Estrogen receptor positive status [ER+]: Secondary | ICD-10-CM | POA: Diagnosis not present

## 2020-07-03 DIAGNOSIS — R921 Mammographic calcification found on diagnostic imaging of breast: Secondary | ICD-10-CM | POA: Diagnosis not present

## 2020-07-10 DIAGNOSIS — E039 Hypothyroidism, unspecified: Secondary | ICD-10-CM | POA: Diagnosis not present

## 2020-07-10 DIAGNOSIS — Z853 Personal history of malignant neoplasm of breast: Secondary | ICD-10-CM | POA: Diagnosis not present

## 2020-07-10 DIAGNOSIS — I351 Nonrheumatic aortic (valve) insufficiency: Secondary | ICD-10-CM | POA: Diagnosis not present

## 2020-07-10 DIAGNOSIS — N182 Chronic kidney disease, stage 2 (mild): Secondary | ICD-10-CM | POA: Diagnosis not present

## 2020-07-10 DIAGNOSIS — E1122 Type 2 diabetes mellitus with diabetic chronic kidney disease: Secondary | ICD-10-CM | POA: Diagnosis not present

## 2020-07-10 DIAGNOSIS — Z6829 Body mass index (BMI) 29.0-29.9, adult: Secondary | ICD-10-CM | POA: Diagnosis not present

## 2020-07-10 DIAGNOSIS — K219 Gastro-esophageal reflux disease without esophagitis: Secondary | ICD-10-CM | POA: Diagnosis not present

## 2020-07-10 DIAGNOSIS — I10 Essential (primary) hypertension: Secondary | ICD-10-CM | POA: Diagnosis not present

## 2020-07-10 DIAGNOSIS — E785 Hyperlipidemia, unspecified: Secondary | ICD-10-CM | POA: Diagnosis not present

## 2020-07-12 DIAGNOSIS — E1122 Type 2 diabetes mellitus with diabetic chronic kidney disease: Secondary | ICD-10-CM | POA: Diagnosis not present

## 2020-07-12 DIAGNOSIS — E785 Hyperlipidemia, unspecified: Secondary | ICD-10-CM | POA: Diagnosis not present

## 2020-07-12 DIAGNOSIS — I10 Essential (primary) hypertension: Secondary | ICD-10-CM | POA: Diagnosis not present

## 2020-07-18 DIAGNOSIS — R921 Mammographic calcification found on diagnostic imaging of breast: Secondary | ICD-10-CM | POA: Diagnosis not present

## 2020-07-18 DIAGNOSIS — N6489 Other specified disorders of breast: Secondary | ICD-10-CM | POA: Diagnosis not present

## 2020-07-18 DIAGNOSIS — N641 Fat necrosis of breast: Secondary | ICD-10-CM | POA: Diagnosis not present

## 2020-07-18 DIAGNOSIS — R928 Other abnormal and inconclusive findings on diagnostic imaging of breast: Secondary | ICD-10-CM | POA: Diagnosis not present

## 2020-07-25 DIAGNOSIS — Z170 Estrogen receptor positive status [ER+]: Secondary | ICD-10-CM | POA: Diagnosis not present

## 2020-07-25 DIAGNOSIS — C50412 Malignant neoplasm of upper-outer quadrant of left female breast: Secondary | ICD-10-CM | POA: Diagnosis not present

## 2020-07-30 DIAGNOSIS — E785 Hyperlipidemia, unspecified: Secondary | ICD-10-CM | POA: Diagnosis not present

## 2020-07-30 DIAGNOSIS — Z139 Encounter for screening, unspecified: Secondary | ICD-10-CM | POA: Diagnosis not present

## 2020-07-30 DIAGNOSIS — Z0000 Encounter for general adult medical examination without abnormal findings: Secondary | ICD-10-CM | POA: Diagnosis not present

## 2020-07-30 DIAGNOSIS — Z9181 History of falling: Secondary | ICD-10-CM | POA: Diagnosis not present

## 2020-07-30 DIAGNOSIS — Z1331 Encounter for screening for depression: Secondary | ICD-10-CM | POA: Diagnosis not present

## 2020-08-09 DIAGNOSIS — C50412 Malignant neoplasm of upper-outer quadrant of left female breast: Secondary | ICD-10-CM | POA: Diagnosis not present

## 2020-08-09 DIAGNOSIS — Z170 Estrogen receptor positive status [ER+]: Secondary | ICD-10-CM | POA: Diagnosis not present

## 2020-08-19 DIAGNOSIS — R309 Painful micturition, unspecified: Secondary | ICD-10-CM | POA: Diagnosis not present

## 2020-08-19 DIAGNOSIS — R300 Dysuria: Secondary | ICD-10-CM | POA: Diagnosis not present

## 2020-08-19 DIAGNOSIS — N3090 Cystitis, unspecified without hematuria: Secondary | ICD-10-CM | POA: Diagnosis not present

## 2020-08-26 NOTE — Progress Notes (Signed)
Kimberly Yu  [address] [place],  [place]  [address] [phone]  Clinic Day:  08/30/2020  Referring physician: Nicoletta Dress, MD  This document serves as a record of services personally performed by Dequincy Macarthur Critchley, MD. It was created on their behalf by [facility] E, a trained medical scribe. The creation of this record is based on the scribe's personal observations and the provider's statements to them.  HISTORY OF PRESENT ILLNESS:  The patient is a 80 y.o. 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  She completed her adjuvant chemotherapy, which consisted of weekly paclitaxel/Herceptin, followed by year-long maintenance Herceptin.  She continues to take anastrazole for her adjuvant endocrine endocrine therapy.   She comes in today for routine follow-up.  Since her last visit, the patient has been doing well.  She denies having any particular changes in her breasts which concern her for  disease recurrence.  Of note, her recent annual mammogram in May 2022 showed suspicious calcifications in her left breast.  This led to a resection of the area in question, for which only fat necrosis was seen.    PHYSICAL EXAM:  Blood pressure (!) 166/77, pulse 82, temperature 98.2 F (36.8 C), resp. rate 14, height 5\' 3"  (1.6 m), weight 166 lb 9.6 oz (75.6 kg), SpO2 96 %. Wt Readings from Last 3 Encounters:  08/30/20 166 lb 9.6 oz (75.6 kg)  04/30/20 168 lb 8 oz (76.4 kg)  01/01/20 163 lb 1.6 oz (74 kg)   Body mass index is 29.51 kg/m. Performance status (ECOG): 1 Physical Exam Constitutional:      Appearance: Normal appearance.  HENT:     Mouth/Throat:     Pharynx: Oropharynx is clear. No oropharyngeal exudate.  Cardiovascular:     Rate and Rhythm: Normal rate and regular rhythm.     Heart sounds: No murmur heard.   No friction rub. No gallop.  Pulmonary:      Breath sounds: Normal breath sounds.  Chest:  Breasts:    Right: No swelling, bleeding, inverted nipple, mass, nipple discharge, skin change, axillary adenopathy or supraclavicular adenopathy.     Left: No swelling, bleeding, inverted nipple, mass, nipple discharge, skin change, axillary adenopathy or supraclavicular adenopathy.  Abdominal:     General: Bowel sounds are normal. There is no distension.     Palpations: Abdomen is soft. There is no mass.     Tenderness: There is no abdominal tenderness.  Musculoskeletal:        General: No tenderness.     Cervical back: Normal range of motion and neck supple.     Right lower leg: No edema.     Left lower leg: No edema.  Lymphadenopathy:     Cervical: No cervical adenopathy.     Right cervical: No superficial, deep or posterior cervical adenopathy.    Left cervical: No superficial, deep or posterior cervical adenopathy.     Upper Body:     Right upper body: No supraclavicular or axillary adenopathy.     Left upper body: No supraclavicular or axillary adenopathy.     Lower Body: No right inguinal adenopathy. No left inguinal adenopathy.  Skin:    Coloration: Skin is not jaundiced.     Findings: No lesion or rash.  Neurological:     General: No focal deficit present.     Mental Status: She is alert  and oriented to person, place, and time. Mental status is at baseline.  Psychiatric:        Mood and Affect: Mood normal.        Behavior: Behavior normal.        Thought Content: Thought content normal.        Judgment: Judgment normal.   MAMMOGRAPHY RESULTS: EXAM: 07/03/2020 DIGITAL DIAGNOSTIC BILATERAL MAMMOGRAM WITH TOMOSYNTHESIS AND CAD  ACR Breast Density Category b: There are scattered areas of fibroglandular density.  FINDINGS: Just superior and lateral to the lumpectomy site in the left breast, there is a 3 mm group of new round calcifications. The lumpectomy site is otherwise stable. No other suspicious calcifications,  masses or areas of distortion are seen in the bilateral breasts.  IMPRESSION: 1. There is a new indeterminate 3 mm group of calcifications adjacent to the left breast lumpectomy site. This may be the result of fat necrosis, however further evaluation with biopsy is recommended. 2.  Stable left breast lumpectomy. 3.  No evidence of malignancy in the right breast.  PATHOLOGY:   ASSESSMENT & PLAN:  Assessment/Plan:  A 80 y.o. 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  Based upon her clinical breast exam today and recent left breast workup, the patient remains disease-free.  She knows to continue taking anastrozole 1 mg daily to complete 5 years of adjuvant endocrine therapy.  Clinically, the patient appears to be doing very well.  I do feel comfortable seeing her back in 6 months for a repeat clinical breast exam.  The patient understands all the plans discussed today and is in agreement with them.     I, Rita Ohara, am acting as scribe for Marice Potter, MD    I have reviewed this report as typed by the medical scribe, and it is complete and accurate.  Dequincy Macarthur Critchley, MD

## 2020-08-30 ENCOUNTER — Telehealth: Payer: Self-pay | Admitting: Oncology

## 2020-08-30 ENCOUNTER — Inpatient Hospital Stay: Payer: Medicare PPO | Attending: Oncology | Admitting: Oncology

## 2020-08-30 ENCOUNTER — Other Ambulatory Visit: Payer: Self-pay

## 2020-08-30 VITALS — BP 166/77 | HR 82 | Temp 98.2°F | Resp 14 | Ht 63.0 in | Wt 166.6 lb

## 2020-08-30 DIAGNOSIS — C50412 Malignant neoplasm of upper-outer quadrant of left female breast: Secondary | ICD-10-CM

## 2020-08-30 DIAGNOSIS — Z170 Estrogen receptor positive status [ER+]: Secondary | ICD-10-CM | POA: Diagnosis not present

## 2020-08-30 NOTE — Telephone Encounter (Signed)
Per 7/22 LOS, patient scheduled for Jan 2023 Appt's.  Gave patient Appt Summary

## 2020-09-03 DIAGNOSIS — M5030 Other cervical disc degeneration, unspecified cervical region: Secondary | ICD-10-CM | POA: Diagnosis not present

## 2020-09-03 DIAGNOSIS — M25512 Pain in left shoulder: Secondary | ICD-10-CM | POA: Diagnosis not present

## 2020-09-03 DIAGNOSIS — M542 Cervicalgia: Secondary | ICD-10-CM | POA: Diagnosis not present

## 2020-09-04 DIAGNOSIS — R350 Frequency of micturition: Secondary | ICD-10-CM | POA: Diagnosis not present

## 2020-09-04 DIAGNOSIS — Z6829 Body mass index (BMI) 29.0-29.9, adult: Secondary | ICD-10-CM | POA: Diagnosis not present

## 2020-09-04 DIAGNOSIS — R509 Fever, unspecified: Secondary | ICD-10-CM | POA: Diagnosis not present

## 2020-09-09 DIAGNOSIS — M25512 Pain in left shoulder: Secondary | ICD-10-CM | POA: Diagnosis not present

## 2020-09-09 DIAGNOSIS — M2560 Stiffness of unspecified joint, not elsewhere classified: Secondary | ICD-10-CM | POA: Diagnosis not present

## 2020-09-09 DIAGNOSIS — M542 Cervicalgia: Secondary | ICD-10-CM | POA: Diagnosis not present

## 2020-09-09 DIAGNOSIS — M79602 Pain in left arm: Secondary | ICD-10-CM | POA: Diagnosis not present

## 2020-09-09 DIAGNOSIS — R531 Weakness: Secondary | ICD-10-CM | POA: Diagnosis not present

## 2020-09-09 DIAGNOSIS — H353131 Nonexudative age-related macular degeneration, bilateral, early dry stage: Secondary | ICD-10-CM | POA: Diagnosis not present

## 2020-09-11 DIAGNOSIS — M542 Cervicalgia: Secondary | ICD-10-CM | POA: Diagnosis not present

## 2020-09-11 DIAGNOSIS — M79602 Pain in left arm: Secondary | ICD-10-CM | POA: Diagnosis not present

## 2020-09-11 DIAGNOSIS — M25512 Pain in left shoulder: Secondary | ICD-10-CM | POA: Diagnosis not present

## 2020-09-11 DIAGNOSIS — R531 Weakness: Secondary | ICD-10-CM | POA: Diagnosis not present

## 2020-09-11 DIAGNOSIS — M2560 Stiffness of unspecified joint, not elsewhere classified: Secondary | ICD-10-CM | POA: Diagnosis not present

## 2020-09-16 DIAGNOSIS — M25512 Pain in left shoulder: Secondary | ICD-10-CM | POA: Diagnosis not present

## 2020-09-16 DIAGNOSIS — M2560 Stiffness of unspecified joint, not elsewhere classified: Secondary | ICD-10-CM | POA: Diagnosis not present

## 2020-09-16 DIAGNOSIS — R531 Weakness: Secondary | ICD-10-CM | POA: Diagnosis not present

## 2020-09-16 DIAGNOSIS — M542 Cervicalgia: Secondary | ICD-10-CM | POA: Diagnosis not present

## 2020-09-16 DIAGNOSIS — M79602 Pain in left arm: Secondary | ICD-10-CM | POA: Diagnosis not present

## 2020-09-18 DIAGNOSIS — M79602 Pain in left arm: Secondary | ICD-10-CM | POA: Diagnosis not present

## 2020-09-18 DIAGNOSIS — M2560 Stiffness of unspecified joint, not elsewhere classified: Secondary | ICD-10-CM | POA: Diagnosis not present

## 2020-09-18 DIAGNOSIS — R531 Weakness: Secondary | ICD-10-CM | POA: Diagnosis not present

## 2020-09-18 DIAGNOSIS — M25512 Pain in left shoulder: Secondary | ICD-10-CM | POA: Diagnosis not present

## 2020-09-18 DIAGNOSIS — M542 Cervicalgia: Secondary | ICD-10-CM | POA: Diagnosis not present

## 2020-09-23 DIAGNOSIS — M542 Cervicalgia: Secondary | ICD-10-CM | POA: Diagnosis not present

## 2020-09-23 DIAGNOSIS — M79602 Pain in left arm: Secondary | ICD-10-CM | POA: Diagnosis not present

## 2020-09-23 DIAGNOSIS — M2560 Stiffness of unspecified joint, not elsewhere classified: Secondary | ICD-10-CM | POA: Diagnosis not present

## 2020-09-23 DIAGNOSIS — R531 Weakness: Secondary | ICD-10-CM | POA: Diagnosis not present

## 2020-09-23 DIAGNOSIS — M25512 Pain in left shoulder: Secondary | ICD-10-CM | POA: Diagnosis not present

## 2020-09-25 DIAGNOSIS — M2560 Stiffness of unspecified joint, not elsewhere classified: Secondary | ICD-10-CM | POA: Diagnosis not present

## 2020-09-25 DIAGNOSIS — R531 Weakness: Secondary | ICD-10-CM | POA: Diagnosis not present

## 2020-09-25 DIAGNOSIS — M542 Cervicalgia: Secondary | ICD-10-CM | POA: Diagnosis not present

## 2020-09-25 DIAGNOSIS — M25512 Pain in left shoulder: Secondary | ICD-10-CM | POA: Diagnosis not present

## 2020-09-25 DIAGNOSIS — M79602 Pain in left arm: Secondary | ICD-10-CM | POA: Diagnosis not present

## 2020-09-28 IMAGING — MG MM CLIP PLACEMENT
2 series · 2 of 2 positions shown · non-contrast
Comparison: Previous exam(s).

CLINICAL DATA: Status post stereotactic guided core biopsy of
asymmetry in the UPPER central portion of the LEFT breast.

EXAM:
DIAGNOSTIC LEFT MAMMOGRAM POST STEREOTACTIC BIOPSY

[L ML]
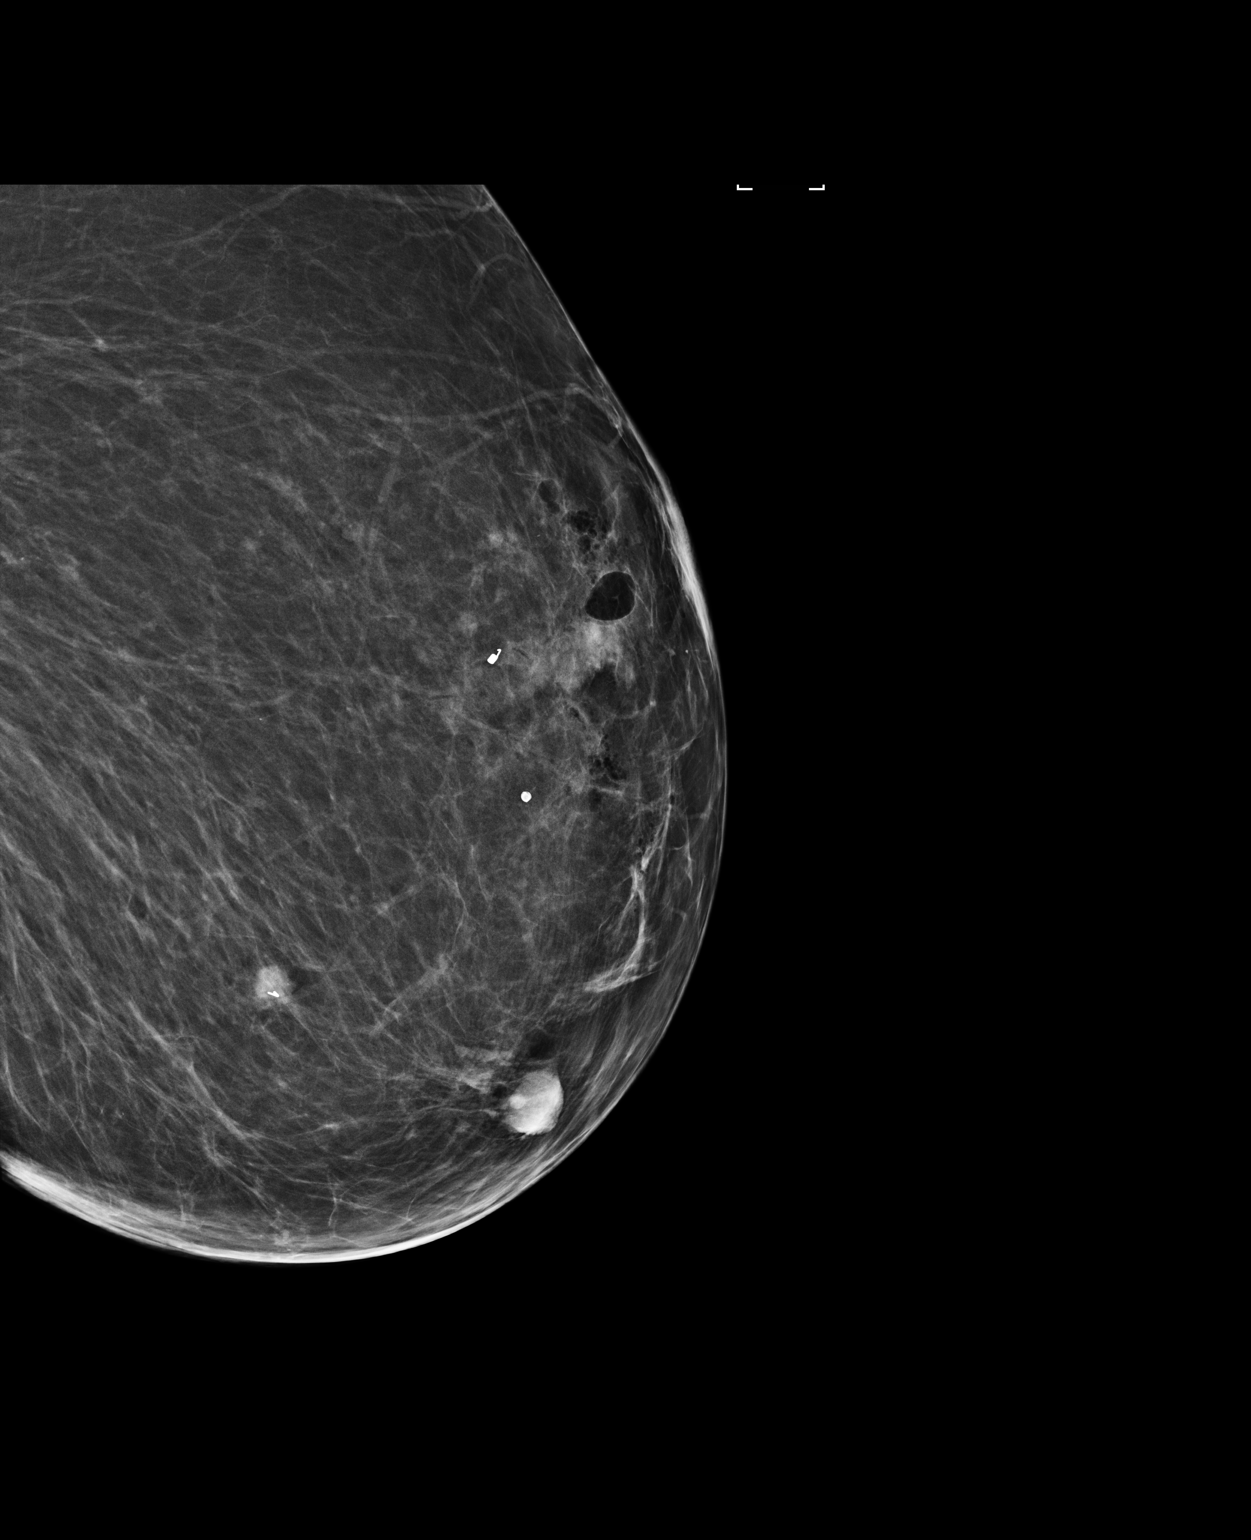

[L CC]
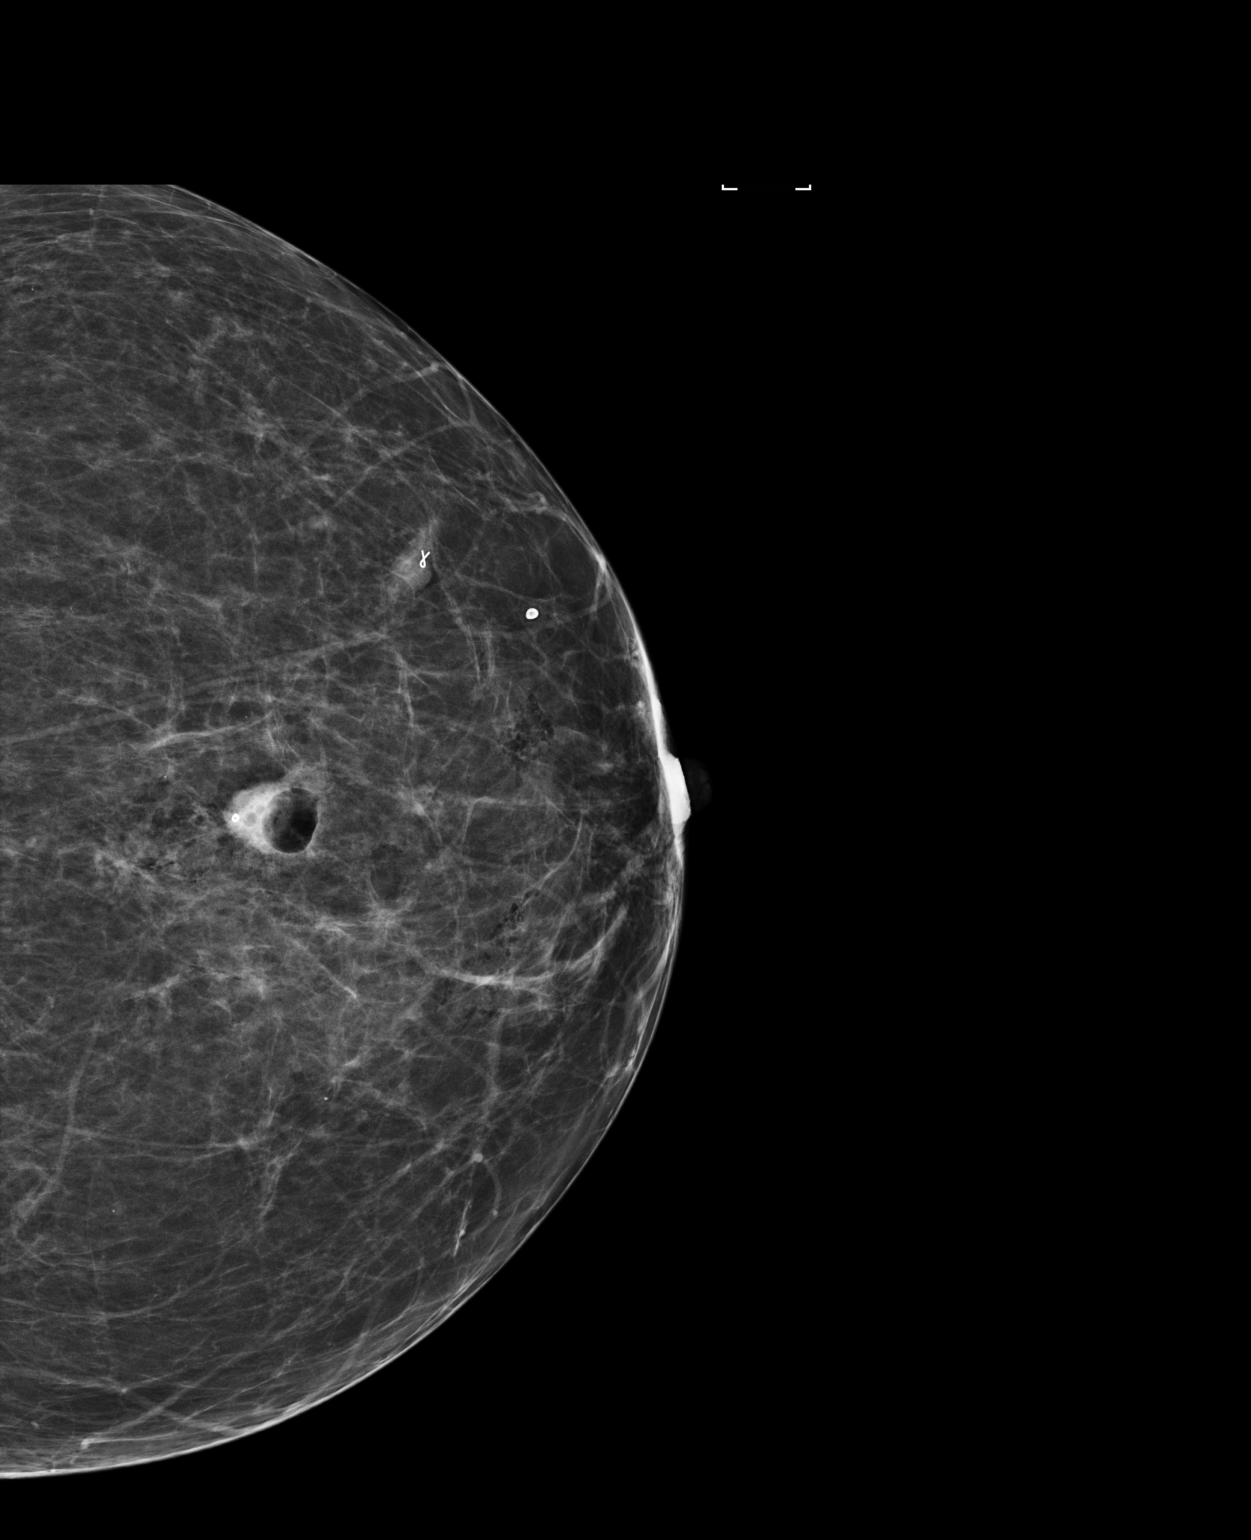

[2 of 2 positions shown; findings below may reference images not displayed]

FINDINGS: Mammographic images were obtained following stereotactic guided
biopsy of asymmetry in the UPPER central LEFT breast and placement
of a coil shaped clip. The clip is identified adjacent to the
asymmetry in the UPPER central LEFT breast.
IMPRESSION: Tissue marker clip in the expected location following biopsy.

Final Assessment: Post Procedure Mammograms for Marker Placement

## 2020-09-28 IMAGING — MG STEREOTACTIC VACUUM ASSIST RIGHT
8 of 10 series · 8 of 22 positions shown · non-contrast
Comparison: Previous exams.
COMPARISON: Previous exams.

Addendum:
CLINICAL DATA: The patient presents for stereotactic guided core
biopsy of calcifications in the LOWER INNER QUADRANT of the RIGHT
breast. Patient has a recent diagnosis of LEFT breast cancer
following ultrasound-guided core biopsy.

EXAM:
RIGHT BREAST STEREOTACTIC CORE NEEDLE BIOPSY

[R (1 of 5)]
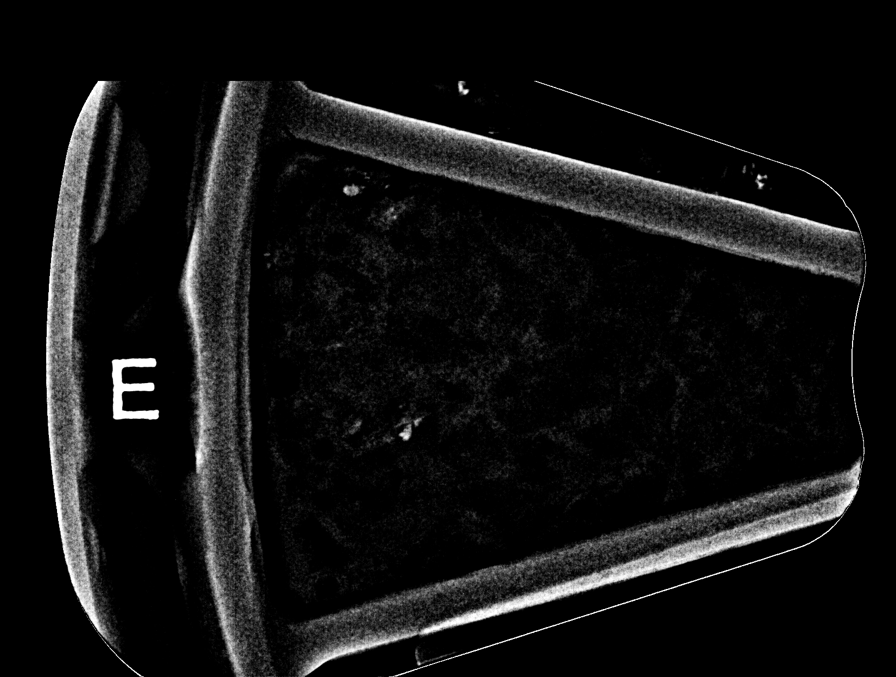

[R (2 of 5)]
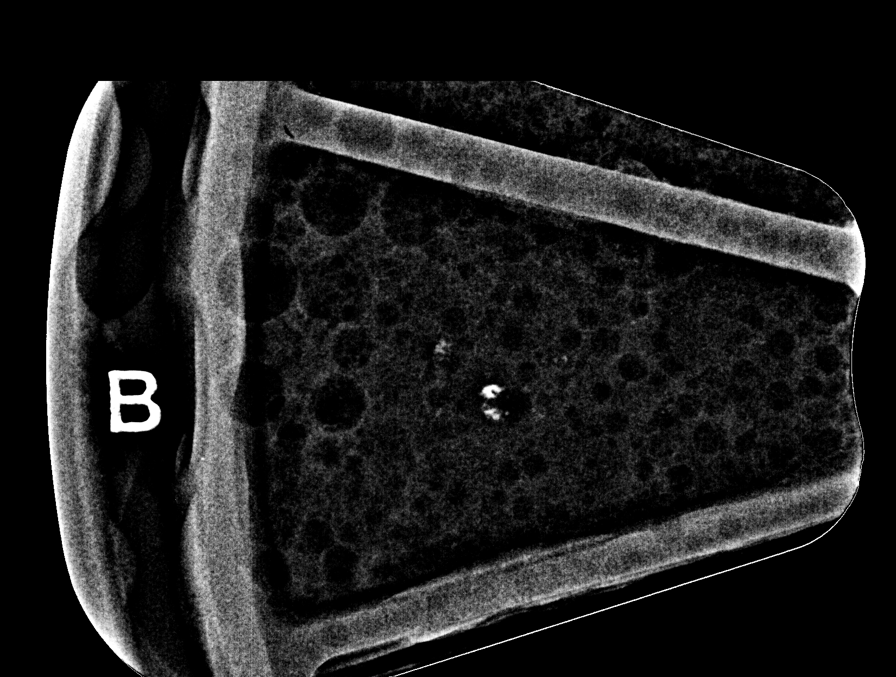

[R (3 of 5)]
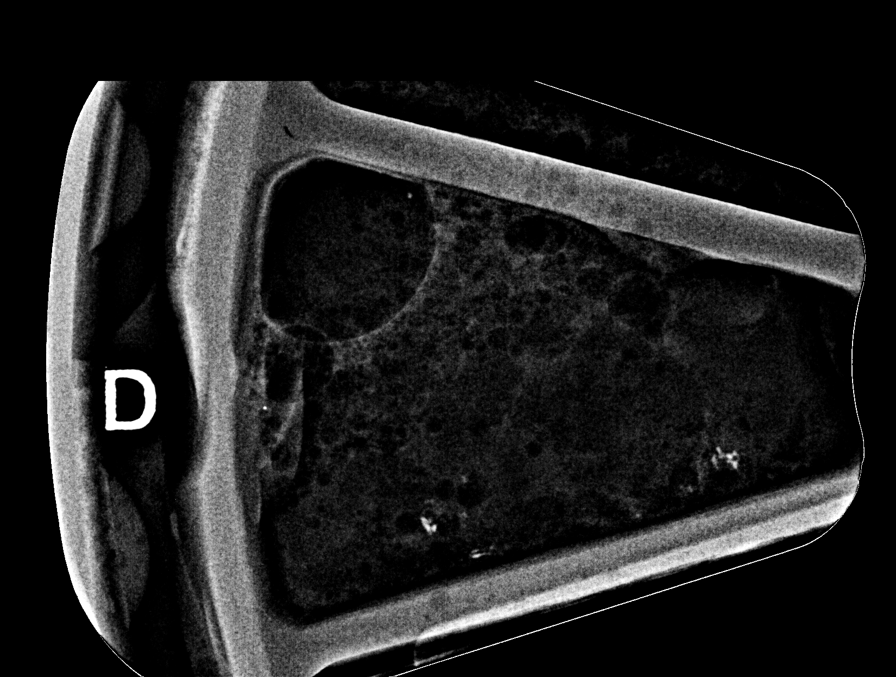

[R (4 of 5)]
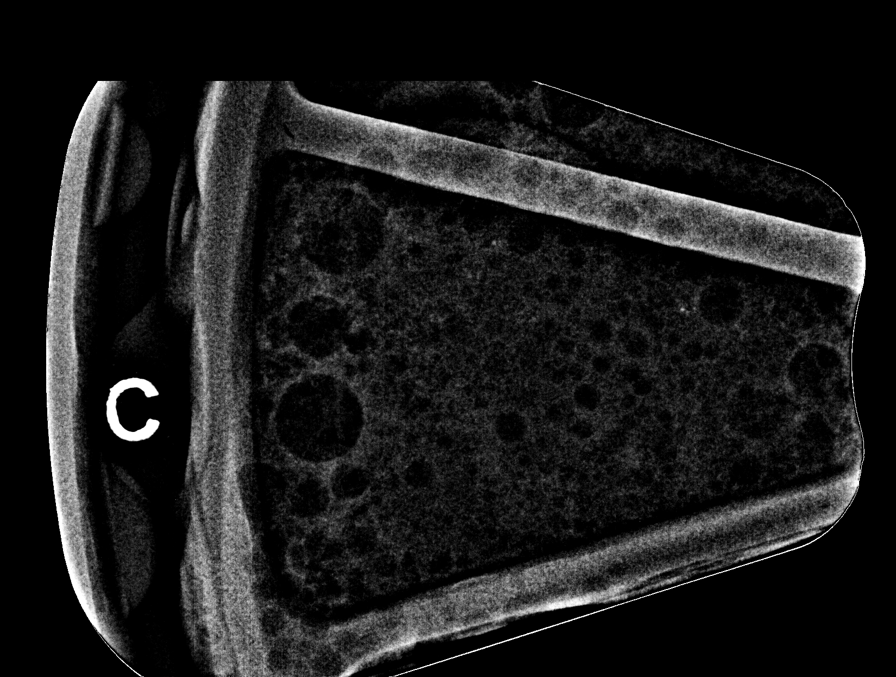

[R (5 of 5)]
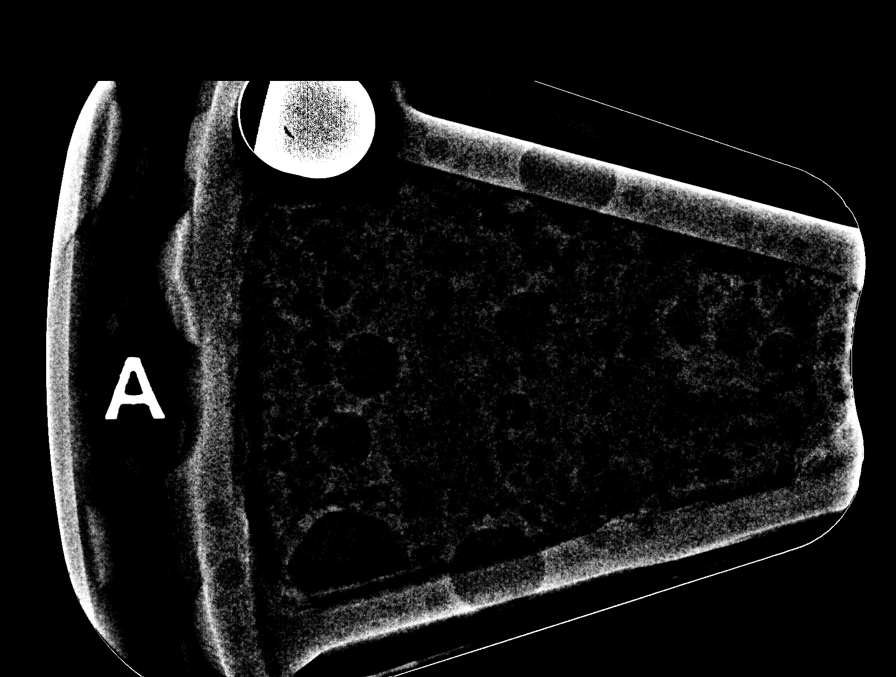

[R ML (1 of 2)]
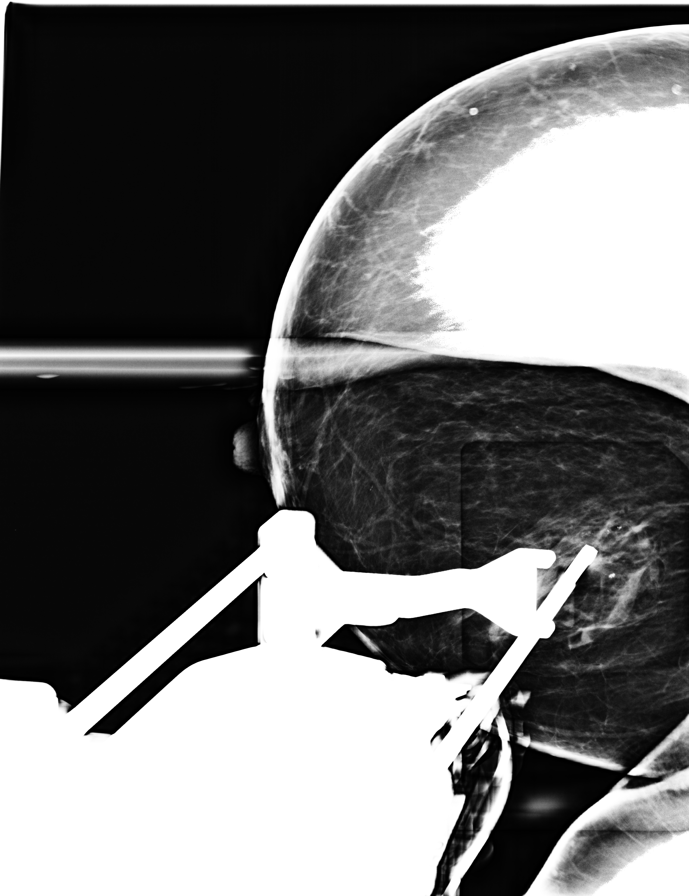

[R ML (2 of 2)]
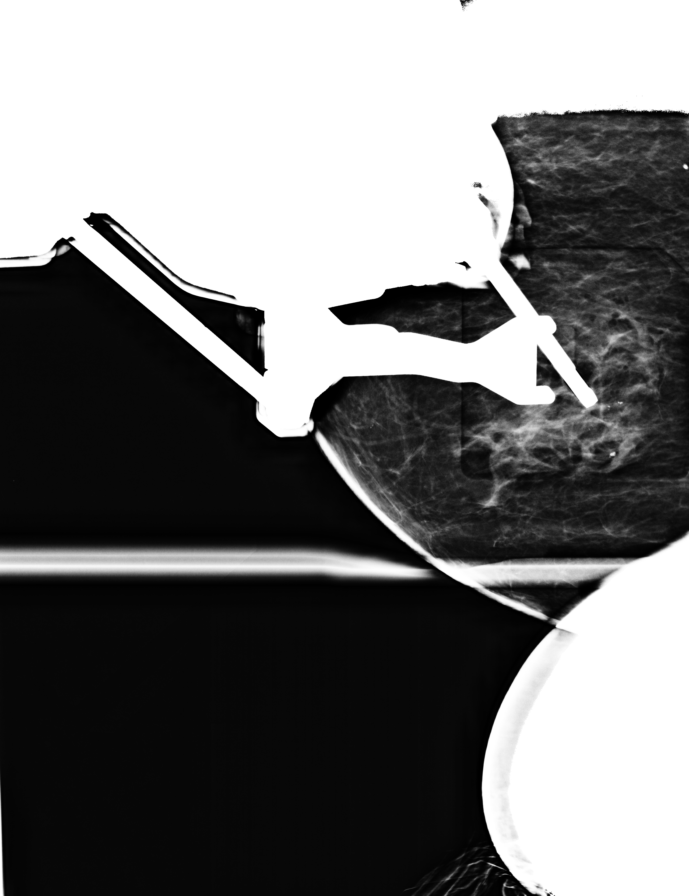

[R ML tomo · tomo slice 31/62.0]
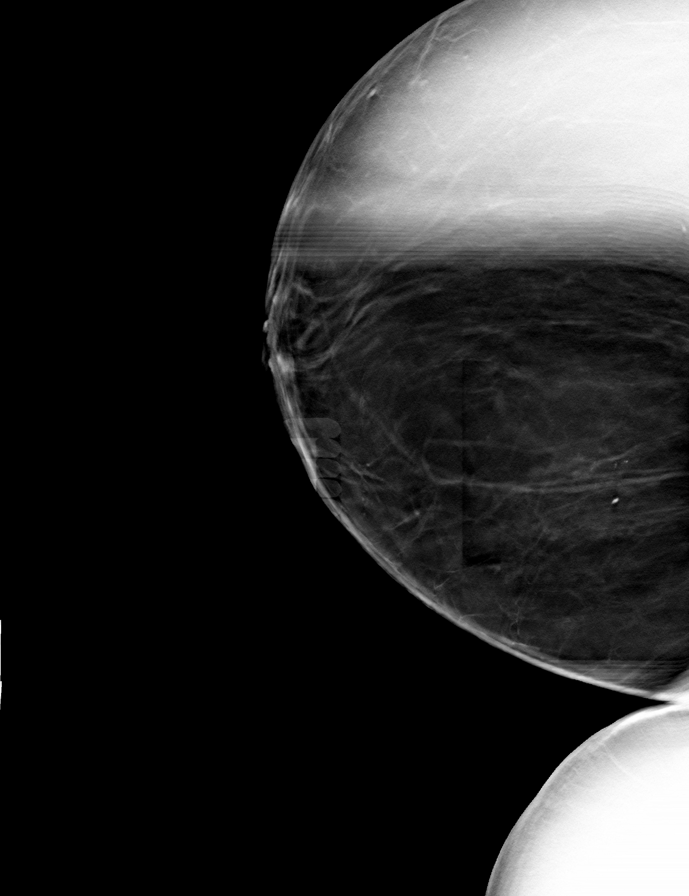

[8 of 22 positions shown; findings below may reference images not displayed]



Using sterile technique and 1% Lidocaine as local anesthetic, under
stereotactic guidance, a 9 gauge vacuum assisted device was used to
perform core needle biopsy of calcifications in the LOWER INNER
QUADRANT of the RIGHT breast using a MEDIAL to LATERAL approach.
Specimen radiograph was performed showing calcifications in numerous
samples. Specimens with calcifications are identified for pathology.

Lesion quadrant: LOWER INNER QUADRANT RIGHT breast

At the conclusion of the procedure, a coil shaped tissue marker clip
was deployed into the biopsy cavity. Follow-up 2-view mammogram was
performed and dictated separately.
IMPRESSION: Stereotactic-guided biopsy of RIGHT breast calcifications. No
apparent complications.

ADDENDUM:
Pathology revealed FIBROCYSTIC CHANGE WITH DYSTROPHIC CALCIFICATIONS
of the RIGHT breast, lower inner quadrant (coil clip). This was
found to be concordant by Dr. Anayely Haddad.

Pathology revealed COMPLEX SCLEROSING LESION, FIBROCYSTIC CHANGE of
the LEFT breast, upper central (coil clip). This was found to be
concordant by Dr. Anayely Haddad, with excision recommended.

Pathology results were discussed with the patient by telephone. The
patient reported doing well after the biopsies with tenderness at
the sites. Post biopsy instructions and care were reviewed and
questions were answered. The patient was encouraged to call The

The patient has a recent diagnosis of LEFT breast cancer and should
follow her outlined treatment plan.

Dr. Luifer Dager of [REDACTED], [HOSPITAL] location, was
notified of biopsy results on August 08, 2018. Imaging and pathology
reports were faxed to Dr. Roemer.

Pathology results reported by Clarice Carmelo, RN on 08/08/2018.



Using sterile technique and 1% Lidocaine as local anesthetic, under
stereotactic guidance, a 9 gauge vacuum assisted device was used to
perform core needle biopsy of calcifications in the LOWER INNER
QUADRANT of the RIGHT breast using a MEDIAL to LATERAL approach.
Specimen radiograph was performed showing calcifications in numerous
samples. Specimens with calcifications are identified for pathology.

Lesion quadrant: LOWER INNER QUADRANT RIGHT breast

At the conclusion of the procedure, a coil shaped tissue marker clip
was deployed into the biopsy cavity. Follow-up 2-view mammogram was
performed and dictated separately.
IMPRESSION: Stereotactic-guided biopsy of RIGHT breast calcifications. No
apparent complications.

## 2020-09-28 IMAGING — MG STEREOTACTIC CORE NEEDLE BIOPSY
8 of 11 series · 8 of 23 positions shown · non-contrast
Comparison: Multiple prior studies including screening mammogram
12/10/2008
COMPARISON: Multiple prior studies including screening mammogram
12/10/2008

Addendum:
CLINICAL DATA: The patient presents for stereotactic guided core
biopsy of asymmetry in the UPPER central portion of the LEFT breast.

EXAM:
LEFT BREAST STEREOTACTIC CORE NEEDLE BIOPSY

[L (1 of 6)]
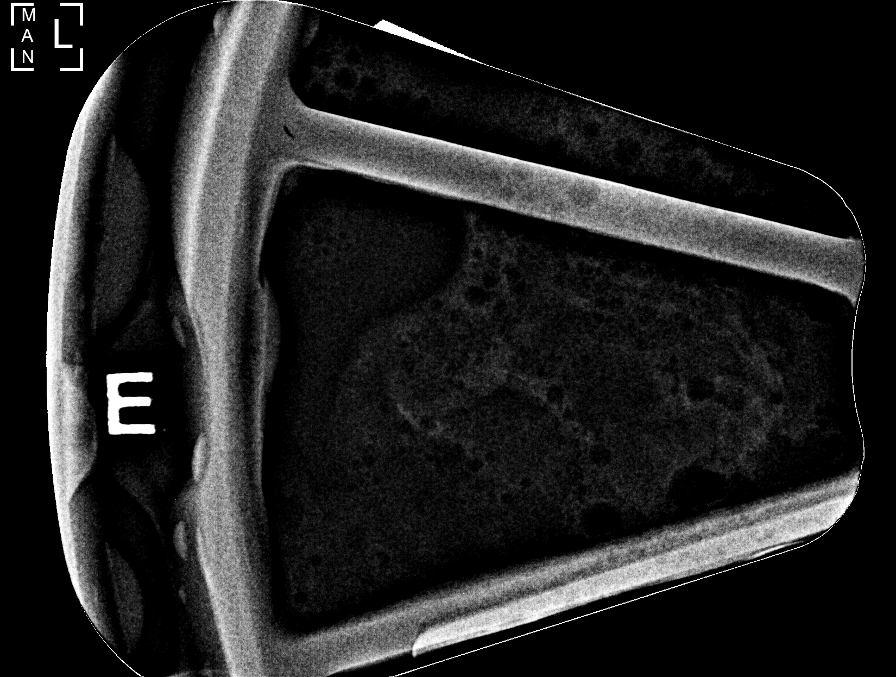

[L (2 of 6)]
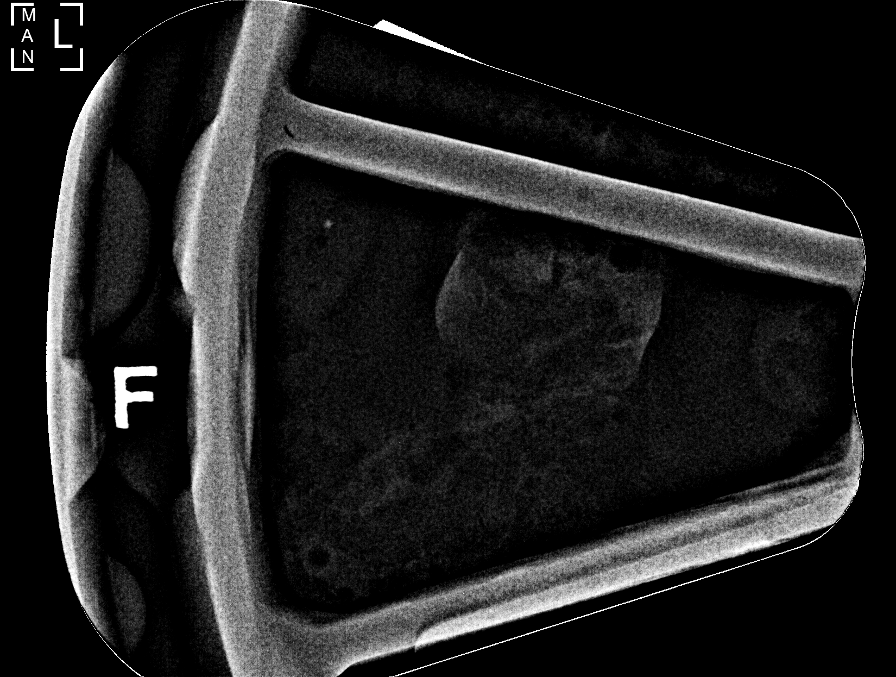

[L (3 of 6)]
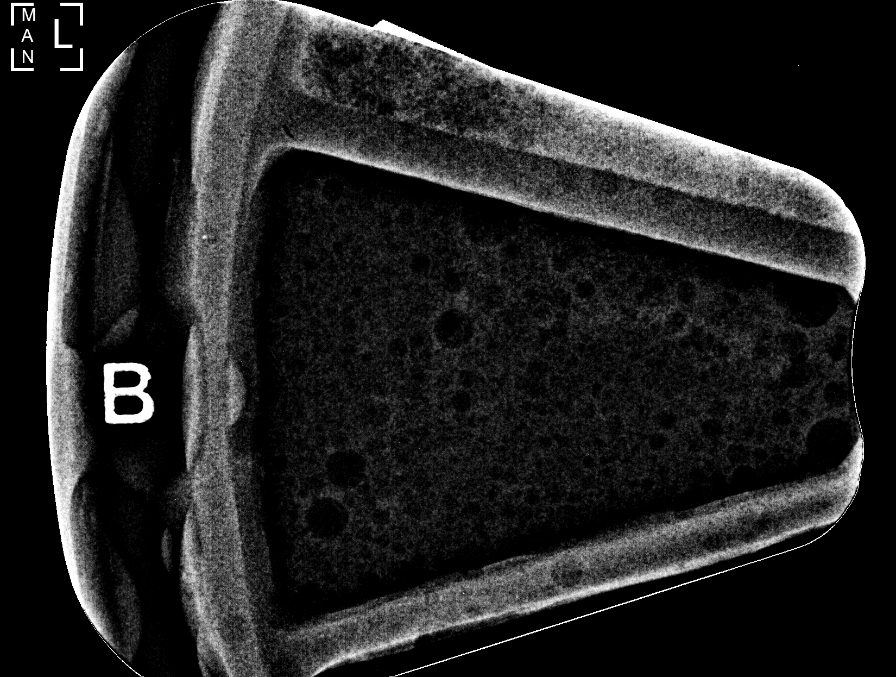

[L (4 of 6)]
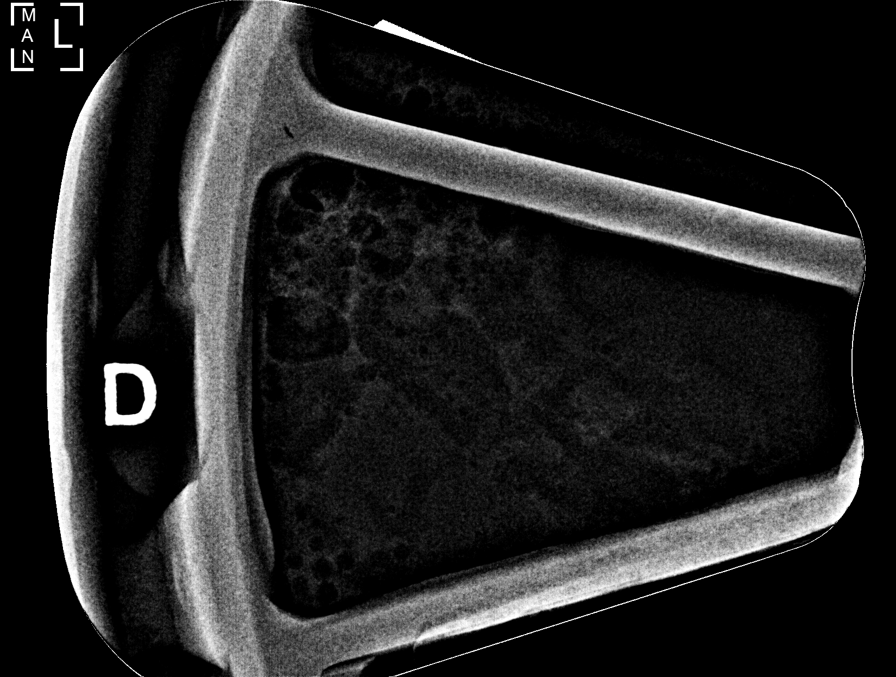

[L (5 of 6)]
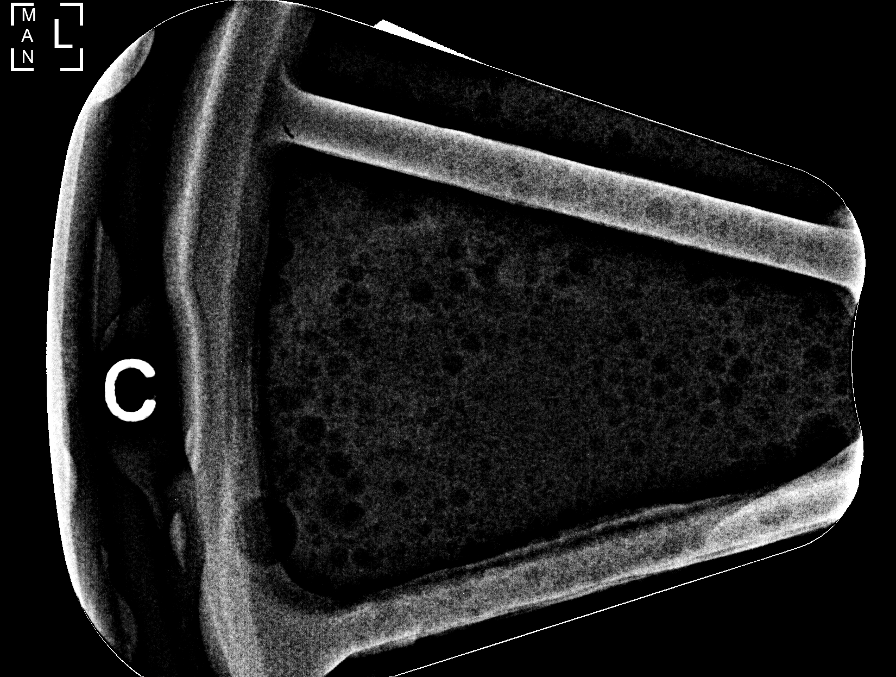

[L (6 of 6)]
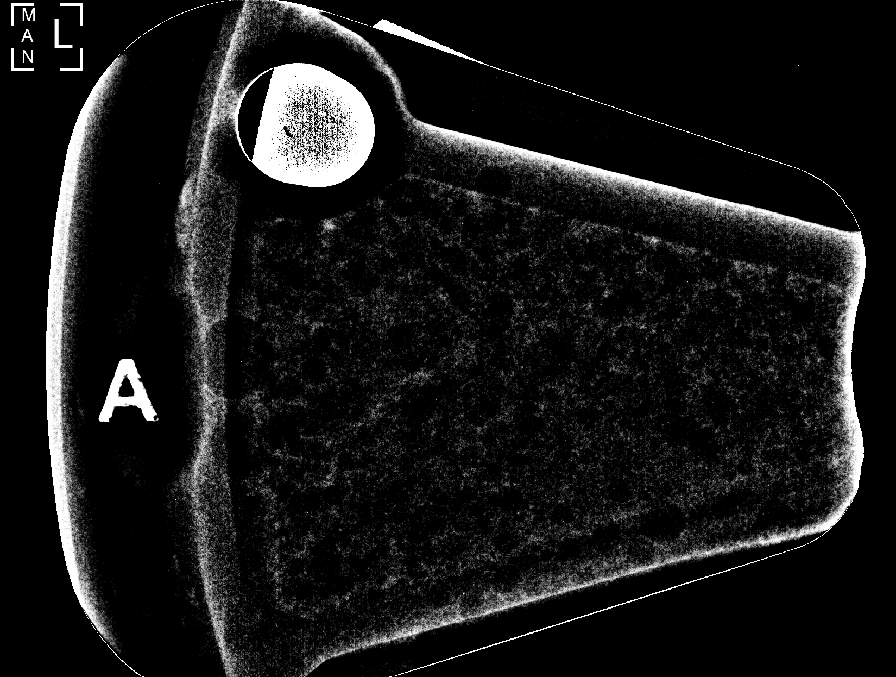

[L CC (1 of 2)]
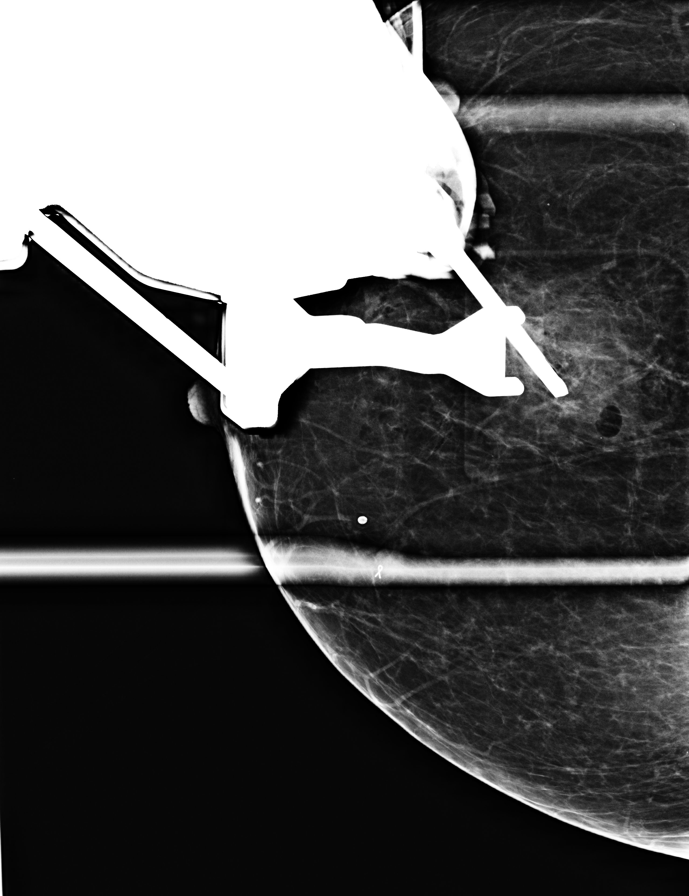

[L CC (2 of 2)]
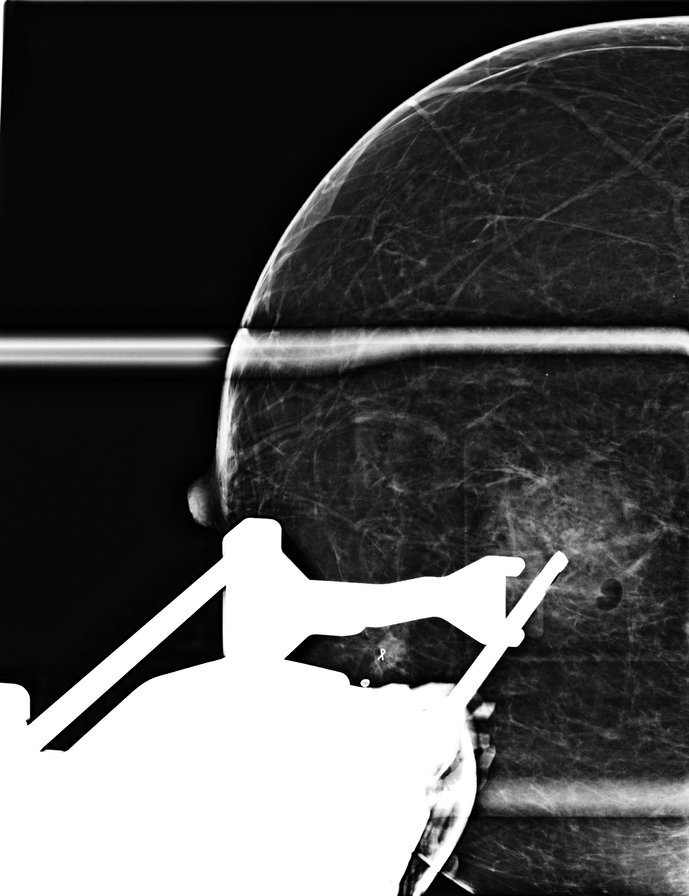

[8 of 23 positions shown; findings below may reference images not displayed]



Multiple prior exams are reviewed. The nodular component in the
anterior aspect of the asymmetry in the UPPER central portion of the
LEFT breast appears new since prior exams and is targeted today.

Using sterile technique and 1% Lidocaine as local anesthetic, under
stereotactic guidance, a 9 gauge vacuum assisted device was used to
perform core needle biopsy of asymmetry in the UPPER central portion
of the LEFT breast using a superior to inferior approach.

Lesion quadrant: UPPER central LEFT breast

At the conclusion of the procedure, a coil shaped tissue marker clip
was deployed into the biopsy cavity. Follow-up 2-view mammogram was
performed and dictated separately.
IMPRESSION: Stereotactic-guided biopsy of LEFT breast asymmetry. No apparent
complications.

ADDENDUM:
Pathology revealed FIBROCYSTIC CHANGE WITH DYSTROPHIC CALCIFICATIONS
of the RIGHT breast, lower inner quadrant (coil clip). This was
found to be concordant by Dr. Makhalido Kennekae.

Pathology revealed COMPLEX SCLEROSING LESION, FIBROCYSTIC CHANGE of
the LEFT breast, upper central (coil clip). This was found to be
concordant by Dr. Makhalido Kennekae, with excision recommended.

Pathology results were discussed with the patient by telephone. The
patient reported doing well after the biopsies with tenderness at
the sites. Post biopsy instructions and care were reviewed and
questions were answered. The patient was encouraged to call The

The patient has a recent diagnosis of LEFT breast cancer and should
follow her outlined treatment plan.

Dr. Yasiel Fick of [REDACTED], [HOSPITAL] location, was
notified of biopsy results on August 08, 2018. Imaging and pathology
reports were faxed to Dr. Dong.

Pathology results reported by Rigent Ramobanaj, RN on 08/08/2018.



Multiple prior exams are reviewed. The nodular component in the
anterior aspect of the asymmetry in the UPPER central portion of the
LEFT breast appears new since prior exams and is targeted today.

Using sterile technique and 1% Lidocaine as local anesthetic, under
stereotactic guidance, a 9 gauge vacuum assisted device was used to
perform core needle biopsy of asymmetry in the UPPER central portion
of the LEFT breast using a superior to inferior approach.

Lesion quadrant: UPPER central LEFT breast

At the conclusion of the procedure, a coil shaped tissue marker clip
was deployed into the biopsy cavity. Follow-up 2-view mammogram was
performed and dictated separately.
IMPRESSION: Stereotactic-guided biopsy of LEFT breast asymmetry. No apparent
complications.

## 2020-09-30 DIAGNOSIS — Z86010 Personal history of colonic polyps: Secondary | ICD-10-CM | POA: Diagnosis not present

## 2020-09-30 DIAGNOSIS — K5730 Diverticulosis of large intestine without perforation or abscess without bleeding: Secondary | ICD-10-CM | POA: Diagnosis not present

## 2020-09-30 DIAGNOSIS — Z1211 Encounter for screening for malignant neoplasm of colon: Secondary | ICD-10-CM | POA: Diagnosis not present

## 2020-10-16 DIAGNOSIS — E785 Hyperlipidemia, unspecified: Secondary | ICD-10-CM | POA: Diagnosis not present

## 2020-10-16 DIAGNOSIS — Z6828 Body mass index (BMI) 28.0-28.9, adult: Secondary | ICD-10-CM | POA: Diagnosis not present

## 2020-10-16 DIAGNOSIS — M5412 Radiculopathy, cervical region: Secondary | ICD-10-CM | POA: Diagnosis not present

## 2020-10-16 DIAGNOSIS — I351 Nonrheumatic aortic (valve) insufficiency: Secondary | ICD-10-CM | POA: Diagnosis not present

## 2020-10-16 DIAGNOSIS — E039 Hypothyroidism, unspecified: Secondary | ICD-10-CM | POA: Diagnosis not present

## 2020-10-16 DIAGNOSIS — N182 Chronic kidney disease, stage 2 (mild): Secondary | ICD-10-CM | POA: Diagnosis not present

## 2020-10-16 DIAGNOSIS — I10 Essential (primary) hypertension: Secondary | ICD-10-CM | POA: Diagnosis not present

## 2020-10-16 DIAGNOSIS — E1122 Type 2 diabetes mellitus with diabetic chronic kidney disease: Secondary | ICD-10-CM | POA: Diagnosis not present

## 2020-10-16 DIAGNOSIS — K219 Gastro-esophageal reflux disease without esophagitis: Secondary | ICD-10-CM | POA: Diagnosis not present

## 2020-10-22 DIAGNOSIS — M542 Cervicalgia: Secondary | ICD-10-CM | POA: Diagnosis not present

## 2020-10-22 DIAGNOSIS — M5030 Other cervical disc degeneration, unspecified cervical region: Secondary | ICD-10-CM | POA: Diagnosis not present

## 2020-10-22 DIAGNOSIS — M25512 Pain in left shoulder: Secondary | ICD-10-CM | POA: Diagnosis not present

## 2020-10-28 DIAGNOSIS — N3090 Cystitis, unspecified without hematuria: Secondary | ICD-10-CM | POA: Diagnosis not present

## 2020-10-28 DIAGNOSIS — R300 Dysuria: Secondary | ICD-10-CM | POA: Diagnosis not present

## 2020-11-05 DIAGNOSIS — R350 Frequency of micturition: Secondary | ICD-10-CM | POA: Diagnosis not present

## 2021-02-17 DIAGNOSIS — J069 Acute upper respiratory infection, unspecified: Secondary | ICD-10-CM | POA: Diagnosis not present

## 2021-02-17 DIAGNOSIS — J309 Allergic rhinitis, unspecified: Secondary | ICD-10-CM | POA: Diagnosis not present

## 2021-02-17 DIAGNOSIS — R42 Dizziness and giddiness: Secondary | ICD-10-CM | POA: Diagnosis not present

## 2021-02-25 NOTE — Progress Notes (Signed)
[facility]  [address] [place],  [place]  [address] [phone]  Clinic Day:  03/03/2021  Referring physician: Nicoletta Dress, MD  This document serves as a record of services personally performed by Jamae Tison Macarthur Critchley, MD. It was created on their behalf by [facility], a trained medical scribe. The creation of this record is based on the scribe's personal observations and the provider's statements to them.  HISTORY OF PRESENT ILLNESS:  The patient is a 81 y.o. 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  She completed her adjuvant chemotherapy, which consisted of weekly paclitaxel/Herceptin, followed by year-long maintenance Herceptin.  She continues to take anastrozole for her adjuvant endocrine endocrine therapy.   She comes in today for routine follow-up.  Since her last visit, the patient has been doing well.  She denies having any particular changes in her breasts which concern her for  disease recurrence.   PHYSICAL EXAM:  Blood pressure (!) 159/73, pulse 92, temperature 98.7 F (37.1 C), resp. rate 16, height 5\' 3"  (1.6 m), weight 165 lb (74.8 kg), SpO2 98 %. Wt Readings from Last 3 Encounters:  03/03/21 165 lb (74.8 kg)  08/30/20 166 lb 9.6 oz (75.6 kg)  04/30/20 168 lb 8 oz (76.4 kg)   Body mass index is 29.23 kg/m. Performance status (ECOG): 1 Physical Exam Constitutional:      Appearance: Normal appearance.  HENT:     Mouth/Throat:     Pharynx: Oropharynx is clear. No oropharyngeal exudate.  Cardiovascular:     Rate and Rhythm: Normal rate and regular rhythm.     Heart sounds: No murmur heard.   No friction rub. No gallop.  Pulmonary:     Breath sounds: Normal breath sounds.  Chest:  Breasts:    Right: No swelling, bleeding, inverted nipple, mass, nipple discharge or skin change.     Left: No swelling, bleeding, inverted nipple,  mass, nipple discharge or skin change.  Abdominal:     General: Bowel sounds are normal. There is no distension.     Palpations: Abdomen is soft. There is no mass.     Tenderness: There is no abdominal tenderness.  Musculoskeletal:        General: No tenderness.     Cervical back: Normal range of motion and neck supple.     Right lower leg: No edema.     Left lower leg: No edema.  Lymphadenopathy:     Cervical: No cervical adenopathy.     Right cervical: No superficial, deep or posterior cervical adenopathy.    Left cervical: No superficial, deep or posterior cervical adenopathy.     Upper Body:     Right upper body: No supraclavicular or axillary adenopathy.     Left upper body: No supraclavicular or axillary adenopathy.     Lower Body: No right inguinal adenopathy. No left inguinal adenopathy.  Skin:    Coloration: Skin is not jaundiced.     Findings: No lesion or rash.  Neurological:     General: No focal deficit present.     Mental Status: She is alert and oriented to person, place, and time. Mental status is at baseline.  Psychiatric:        Mood and Affect: Mood normal.        Behavior: Behavior normal.        Thought Content: Thought content normal.  Judgment: Judgment normal.    ASSESSMENT & PLAN:  Assessment/Plan:  A 81 y.o. 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  Based upon her clinical breast exam today, the patient remains disease-free.  She knows to continue taking anastrozole 1 mg daily to complete 5 years of adjuvant endocrine therapy.  Clinically, the patient appears to be doing very well.  I will see her back in 6 months for a repeat clinical breast exam.  Her annual mammogram will be scheduled for her next visit for her continued radiographic breast cancer surveillance.  She will also undergo a bone density before next visit to ensure her anastrozole is not leading to any significant bone loss.  The patient  understands all the plans discussed today and is in agreement with them.     I, Rita Ohara, am acting as scribe for Marice Potter, MD    I have reviewed this report as typed by the medical scribe, and it is complete and accurate.  Asmaa Tirpak Macarthur Critchley, MD

## 2021-03-03 ENCOUNTER — Other Ambulatory Visit: Payer: Self-pay | Admitting: Oncology

## 2021-03-03 ENCOUNTER — Inpatient Hospital Stay: Payer: Medicare PPO | Attending: Oncology | Admitting: Oncology

## 2021-03-03 ENCOUNTER — Other Ambulatory Visit: Payer: Self-pay

## 2021-03-03 VITALS — BP 159/73 | HR 92 | Temp 98.7°F | Resp 16 | Ht 63.0 in | Wt 165.0 lb

## 2021-03-03 DIAGNOSIS — C50412 Malignant neoplasm of upper-outer quadrant of left female breast: Secondary | ICD-10-CM | POA: Diagnosis not present

## 2021-03-03 DIAGNOSIS — Z170 Estrogen receptor positive status [ER+]: Secondary | ICD-10-CM

## 2021-03-19 ENCOUNTER — Other Ambulatory Visit: Payer: Self-pay | Admitting: Hematology and Oncology

## 2021-03-19 DIAGNOSIS — C50412 Malignant neoplasm of upper-outer quadrant of left female breast: Secondary | ICD-10-CM

## 2021-03-19 DIAGNOSIS — Z170 Estrogen receptor positive status [ER+]: Secondary | ICD-10-CM

## 2021-03-31 DIAGNOSIS — R350 Frequency of micturition: Secondary | ICD-10-CM | POA: Diagnosis not present

## 2021-03-31 DIAGNOSIS — N390 Urinary tract infection, site not specified: Secondary | ICD-10-CM | POA: Diagnosis not present

## 2021-05-20 DIAGNOSIS — R42 Dizziness and giddiness: Secondary | ICD-10-CM | POA: Diagnosis not present

## 2021-05-20 DIAGNOSIS — R55 Syncope and collapse: Secondary | ICD-10-CM | POA: Diagnosis not present

## 2021-05-20 DIAGNOSIS — I10 Essential (primary) hypertension: Secondary | ICD-10-CM | POA: Diagnosis not present

## 2021-05-20 DIAGNOSIS — E782 Mixed hyperlipidemia: Secondary | ICD-10-CM | POA: Diagnosis not present

## 2021-05-28 DIAGNOSIS — G8929 Other chronic pain: Secondary | ICD-10-CM | POA: Diagnosis not present

## 2021-05-28 DIAGNOSIS — M25561 Pain in right knee: Secondary | ICD-10-CM | POA: Diagnosis not present

## 2021-06-04 ENCOUNTER — Encounter: Payer: Self-pay | Admitting: Oncology

## 2021-06-04 DIAGNOSIS — M8588 Other specified disorders of bone density and structure, other site: Secondary | ICD-10-CM | POA: Diagnosis not present

## 2021-06-04 DIAGNOSIS — M8589 Other specified disorders of bone density and structure, multiple sites: Secondary | ICD-10-CM | POA: Diagnosis not present

## 2021-06-04 DIAGNOSIS — M810 Age-related osteoporosis without current pathological fracture: Secondary | ICD-10-CM | POA: Diagnosis not present

## 2021-06-06 DIAGNOSIS — M7542 Impingement syndrome of left shoulder: Secondary | ICD-10-CM | POA: Diagnosis not present

## 2021-06-26 DIAGNOSIS — R300 Dysuria: Secondary | ICD-10-CM | POA: Diagnosis not present

## 2021-06-26 DIAGNOSIS — R350 Frequency of micturition: Secondary | ICD-10-CM | POA: Diagnosis not present

## 2021-06-30 DIAGNOSIS — N390 Urinary tract infection, site not specified: Secondary | ICD-10-CM | POA: Diagnosis not present

## 2021-06-30 DIAGNOSIS — M810 Age-related osteoporosis without current pathological fracture: Secondary | ICD-10-CM | POA: Diagnosis not present

## 2021-06-30 DIAGNOSIS — I10 Essential (primary) hypertension: Secondary | ICD-10-CM | POA: Diagnosis not present

## 2021-06-30 DIAGNOSIS — E785 Hyperlipidemia, unspecified: Secondary | ICD-10-CM | POA: Diagnosis not present

## 2021-07-03 DIAGNOSIS — N3281 Overactive bladder: Secondary | ICD-10-CM | POA: Diagnosis not present

## 2021-07-03 DIAGNOSIS — Z79899 Other long term (current) drug therapy: Secondary | ICD-10-CM | POA: Diagnosis not present

## 2021-07-03 DIAGNOSIS — N390 Urinary tract infection, site not specified: Secondary | ICD-10-CM | POA: Diagnosis not present

## 2021-07-03 DIAGNOSIS — N952 Postmenopausal atrophic vaginitis: Secondary | ICD-10-CM | POA: Diagnosis not present

## 2021-07-10 DIAGNOSIS — M5136 Other intervertebral disc degeneration, lumbar region: Secondary | ICD-10-CM | POA: Diagnosis not present

## 2021-07-10 DIAGNOSIS — M461 Sacroiliitis, not elsewhere classified: Secondary | ICD-10-CM | POA: Diagnosis not present

## 2021-07-10 DIAGNOSIS — M4316 Spondylolisthesis, lumbar region: Secondary | ICD-10-CM | POA: Diagnosis not present

## 2021-07-21 DIAGNOSIS — Z1231 Encounter for screening mammogram for malignant neoplasm of breast: Secondary | ICD-10-CM | POA: Diagnosis not present

## 2021-07-25 DIAGNOSIS — C50412 Malignant neoplasm of upper-outer quadrant of left female breast: Secondary | ICD-10-CM | POA: Diagnosis not present

## 2021-07-25 DIAGNOSIS — Z170 Estrogen receptor positive status [ER+]: Secondary | ICD-10-CM | POA: Diagnosis not present

## 2021-07-29 DIAGNOSIS — I679 Cerebrovascular disease, unspecified: Secondary | ICD-10-CM | POA: Diagnosis not present

## 2021-07-29 DIAGNOSIS — R413 Other amnesia: Secondary | ICD-10-CM | POA: Diagnosis not present

## 2021-07-29 DIAGNOSIS — I10 Essential (primary) hypertension: Secondary | ICD-10-CM | POA: Diagnosis not present

## 2021-08-15 DIAGNOSIS — E785 Hyperlipidemia, unspecified: Secondary | ICD-10-CM | POA: Diagnosis not present

## 2021-08-15 DIAGNOSIS — Z139 Encounter for screening, unspecified: Secondary | ICD-10-CM | POA: Diagnosis not present

## 2021-08-15 DIAGNOSIS — Z0000 Encounter for general adult medical examination without abnormal findings: Secondary | ICD-10-CM | POA: Diagnosis not present

## 2021-08-15 DIAGNOSIS — Z1331 Encounter for screening for depression: Secondary | ICD-10-CM | POA: Diagnosis not present

## 2021-08-15 DIAGNOSIS — Z9181 History of falling: Secondary | ICD-10-CM | POA: Diagnosis not present

## 2021-08-19 DIAGNOSIS — R413 Other amnesia: Secondary | ICD-10-CM | POA: Diagnosis not present

## 2021-08-19 DIAGNOSIS — R42 Dizziness and giddiness: Secondary | ICD-10-CM | POA: Diagnosis not present

## 2021-08-20 DIAGNOSIS — N952 Postmenopausal atrophic vaginitis: Secondary | ICD-10-CM | POA: Diagnosis not present

## 2021-08-20 DIAGNOSIS — N390 Urinary tract infection, site not specified: Secondary | ICD-10-CM | POA: Diagnosis not present

## 2021-08-20 DIAGNOSIS — N3281 Overactive bladder: Secondary | ICD-10-CM | POA: Diagnosis not present

## 2021-08-20 DIAGNOSIS — R3911 Hesitancy of micturition: Secondary | ICD-10-CM | POA: Diagnosis not present

## 2021-08-22 DIAGNOSIS — F0150 Vascular dementia without behavioral disturbance: Secondary | ICD-10-CM | POA: Diagnosis not present

## 2021-08-22 DIAGNOSIS — R413 Other amnesia: Secondary | ICD-10-CM | POA: Diagnosis not present

## 2021-08-22 DIAGNOSIS — I679 Cerebrovascular disease, unspecified: Secondary | ICD-10-CM | POA: Diagnosis not present

## 2021-08-31 NOTE — Progress Notes (Signed)
[facility]  [address] [place],  [place]  [address] [phone]  Clinic Day:  09/01/2021  Referring physician: Nicoletta Dress, MD  HISTORY OF PRESENT ILLNESS:  The patient is a 81 y.o. 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  She completed her adjuvant chemotherapy, which consisted of weekly paclitaxel/Herceptin, followed by year-long maintenance Herceptin.  She continues to take anastrozole for her adjuvant endocrine endocrine therapy.   She comes in today for routine follow-up.  Since her last visit, the patient has been doing well.  She denies having any particular changes in her breasts which concern her for  disease recurrence.  Of note, her annual mammogram in June 2023 showed no evidence of disease recurrence.  Her most recent bone density study still showed osteoporosis, with a T score of -2.7.  PHYSICAL EXAM:  Blood pressure (!) 155/69, pulse 73, temperature 98.7 F (37.1 C), resp. rate 14, height '5\' 3"'$  (1.6 m), weight 152 lb 9.6 oz (69.2 kg), SpO2 98 %. Wt Readings from Last 3 Encounters:  09/01/21 152 lb 9.6 oz (69.2 kg)  03/03/21 165 lb (74.8 kg)  08/30/20 166 lb 9.6 oz (75.6 kg)   Body mass index is 27.03 kg/m. Performance status (ECOG): 1 Physical Exam Constitutional:      Appearance: Normal appearance.  HENT:     Mouth/Throat:     Pharynx: Oropharynx is clear. No oropharyngeal exudate.  Cardiovascular:     Rate and Rhythm: Normal rate and regular rhythm.     Heart sounds: No murmur heard.    No friction rub. No gallop.  Pulmonary:     Breath sounds: Normal breath sounds.  Chest:  Breasts:    Right: No swelling, bleeding, inverted nipple, mass, nipple discharge or skin change.     Left: No swelling, bleeding, inverted nipple, mass, nipple discharge or skin change.  Abdominal:     General: Bowel sounds are normal. There is no  distension.     Palpations: Abdomen is soft. There is no mass.     Tenderness: There is no abdominal tenderness.  Musculoskeletal:        General: No tenderness.     Cervical back: Normal range of motion and neck supple.     Right lower leg: No edema.     Left lower leg: No edema.  Lymphadenopathy:     Cervical: No cervical adenopathy.     Right cervical: No superficial, deep or posterior cervical adenopathy.    Left cervical: No superficial, deep or posterior cervical adenopathy.     Upper Body:     Right upper body: No supraclavicular or axillary adenopathy.     Left upper body: No supraclavicular or axillary adenopathy.     Lower Body: No right inguinal adenopathy. No left inguinal adenopathy.  Skin:    Coloration: Skin is not jaundiced.     Findings: No lesion or rash.  Neurological:     General: No focal deficit present.     Mental Status: She is alert and oriented to person, place, and time. Mental status is at baseline.  Psychiatric:        Mood and Affect: Mood normal.        Behavior: Behavior normal.        Thought Content: Thought content normal.        Judgment: Judgment normal.    ASSESSMENT &  PLAN:  Assessment/Plan:  A 81 y.o. 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  Based upon her clinical breast exam today and recent mammogram, the patient remains disease-free.  She knows to continue taking anastrozole 1 mg daily to complete 5 years of adjuvant endocrine therapy.  With respect to her osteoporosis, she knows to continue taking Fosamax on a weekly basis.  She also knows to take her daily allotment of calcium and vitamin D for bone health.  Clinically, the patient appears to be doing very well.  I will see her back in 6 months for a repeat clinical breast exam.  The patient understands all the plans discussed today and is in agreement with them.    Szymon Foiles Macarthur Critchley, MD

## 2021-09-01 ENCOUNTER — Inpatient Hospital Stay: Payer: Medicare PPO | Attending: Oncology | Admitting: Oncology

## 2021-09-01 ENCOUNTER — Telehealth: Payer: Self-pay | Admitting: Oncology

## 2021-09-01 VITALS — BP 155/69 | HR 73 | Temp 98.7°F | Resp 14 | Ht 63.0 in | Wt 152.6 lb

## 2021-09-01 DIAGNOSIS — C50412 Malignant neoplasm of upper-outer quadrant of left female breast: Secondary | ICD-10-CM | POA: Diagnosis not present

## 2021-09-01 DIAGNOSIS — Z170 Estrogen receptor positive status [ER+]: Secondary | ICD-10-CM

## 2021-09-01 NOTE — Telephone Encounter (Signed)
Patient has been scheduled for follow-up visit per 09/01/21 los. Pt given an appt calendar with date and time.  

## 2021-09-15 DIAGNOSIS — H353132 Nonexudative age-related macular degeneration, bilateral, intermediate dry stage: Secondary | ICD-10-CM | POA: Diagnosis not present

## 2021-10-07 DIAGNOSIS — M1990 Unspecified osteoarthritis, unspecified site: Secondary | ICD-10-CM | POA: Diagnosis not present

## 2021-10-07 DIAGNOSIS — R109 Unspecified abdominal pain: Secondary | ICD-10-CM | POA: Diagnosis not present

## 2021-10-07 DIAGNOSIS — S300XXA Contusion of lower back and pelvis, initial encounter: Secondary | ICD-10-CM | POA: Diagnosis not present

## 2021-10-07 DIAGNOSIS — K219 Gastro-esophageal reflux disease without esophagitis: Secondary | ICD-10-CM | POA: Diagnosis not present

## 2021-10-07 DIAGNOSIS — I700 Atherosclerosis of aorta: Secondary | ICD-10-CM | POA: Diagnosis not present

## 2021-10-07 DIAGNOSIS — J9811 Atelectasis: Secondary | ICD-10-CM | POA: Diagnosis not present

## 2021-10-07 DIAGNOSIS — R079 Chest pain, unspecified: Secondary | ICD-10-CM | POA: Diagnosis not present

## 2021-10-27 DIAGNOSIS — E1122 Type 2 diabetes mellitus with diabetic chronic kidney disease: Secondary | ICD-10-CM | POA: Diagnosis not present

## 2021-10-27 DIAGNOSIS — E039 Hypothyroidism, unspecified: Secondary | ICD-10-CM | POA: Diagnosis not present

## 2021-10-27 DIAGNOSIS — I351 Nonrheumatic aortic (valve) insufficiency: Secondary | ICD-10-CM | POA: Diagnosis not present

## 2021-10-27 DIAGNOSIS — N182 Chronic kidney disease, stage 2 (mild): Secondary | ICD-10-CM | POA: Diagnosis not present

## 2021-10-27 DIAGNOSIS — I10 Essential (primary) hypertension: Secondary | ICD-10-CM | POA: Diagnosis not present

## 2021-10-27 DIAGNOSIS — K219 Gastro-esophageal reflux disease without esophagitis: Secondary | ICD-10-CM | POA: Diagnosis not present

## 2021-10-27 DIAGNOSIS — E785 Hyperlipidemia, unspecified: Secondary | ICD-10-CM | POA: Diagnosis not present

## 2021-10-27 DIAGNOSIS — N390 Urinary tract infection, site not specified: Secondary | ICD-10-CM | POA: Diagnosis not present

## 2021-11-24 DIAGNOSIS — R3911 Hesitancy of micturition: Secondary | ICD-10-CM | POA: Diagnosis not present

## 2021-11-24 DIAGNOSIS — N390 Urinary tract infection, site not specified: Secondary | ICD-10-CM | POA: Diagnosis not present

## 2021-11-24 DIAGNOSIS — N952 Postmenopausal atrophic vaginitis: Secondary | ICD-10-CM | POA: Diagnosis not present

## 2021-11-24 DIAGNOSIS — N3281 Overactive bladder: Secondary | ICD-10-CM | POA: Diagnosis not present

## 2021-12-02 ENCOUNTER — Other Ambulatory Visit: Payer: Self-pay | Admitting: Hematology and Oncology

## 2021-12-02 DIAGNOSIS — C50412 Malignant neoplasm of upper-outer quadrant of left female breast: Secondary | ICD-10-CM

## 2021-12-02 MED ORDER — ANASTROZOLE 1 MG PO TABS: ORAL_TABLET | ORAL | 3 refills | Status: AC

## 2021-12-24 DIAGNOSIS — M1711 Unilateral primary osteoarthritis, right knee: Secondary | ICD-10-CM | POA: Diagnosis not present

## 2021-12-29 DIAGNOSIS — M19041 Primary osteoarthritis, right hand: Secondary | ICD-10-CM | POA: Diagnosis not present

## 2022-02-25 DIAGNOSIS — N3281 Overactive bladder: Secondary | ICD-10-CM | POA: Diagnosis not present

## 2022-02-25 DIAGNOSIS — N952 Postmenopausal atrophic vaginitis: Secondary | ICD-10-CM | POA: Diagnosis not present

## 2022-02-25 DIAGNOSIS — N390 Urinary tract infection, site not specified: Secondary | ICD-10-CM | POA: Diagnosis not present

## 2022-02-25 DIAGNOSIS — R3911 Hesitancy of micturition: Secondary | ICD-10-CM | POA: Diagnosis not present

## 2022-03-03 NOTE — Progress Notes (Signed)
[facility]  [address] [place],  [place]  [address] [phone]  Clinic Day:  09/01/2021  Referring physician: Nicoletta Dress, MD  HISTORY OF PRESENT ILLNESS:  The patient is a 82 y.o. 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  She completed her adjuvant chemotherapy, which consisted of weekly paclitaxel/Herceptin, followed by year-long maintenance Herceptin.  She continues to take anastrozole for her adjuvant endocrine endocrine therapy.   She comes in today for routine follow-up.  Since her last visit, the patient has been doing well.  She denies having any particular changes in her breasts which concern her for  disease recurrence.    PHYSICAL EXAM:  There were no vitals taken for this visit. Wt Readings from Last 3 Encounters:  09/01/21 152 lb 9.6 oz (69.2 kg)  03/03/21 165 lb (74.8 kg)  08/30/20 166 lb 9.6 oz (75.6 kg)   There is no height or weight on file to calculate BMI. Performance status (ECOG): 1 Physical Exam Constitutional:      Appearance: Normal appearance.  HENT:     Mouth/Throat:     Pharynx: Oropharynx is clear. No oropharyngeal exudate.  Cardiovascular:     Rate and Rhythm: Normal rate and regular rhythm.     Heart sounds: No murmur heard.    No friction rub. No gallop.  Pulmonary:     Breath sounds: Normal breath sounds.  Chest:  Breasts:    Right: No swelling, bleeding, inverted nipple, mass, nipple discharge or skin change.     Left: No swelling, bleeding, inverted nipple, mass, nipple discharge or skin change.  Abdominal:     General: Bowel sounds are normal. There is no distension.     Palpations: Abdomen is soft. There is no mass.     Tenderness: There is no abdominal tenderness.  Musculoskeletal:        General: No tenderness.     Cervical back: Normal range of motion and neck supple.     Right lower leg: No  edema.     Left lower leg: No edema.  Lymphadenopathy:     Cervical: No cervical adenopathy.     Right cervical: No superficial, deep or posterior cervical adenopathy.    Left cervical: No superficial, deep or posterior cervical adenopathy.     Upper Body:     Right upper body: No supraclavicular or axillary adenopathy.     Left upper body: No supraclavicular or axillary adenopathy.     Lower Body: No right inguinal adenopathy. No left inguinal adenopathy.  Skin:    Coloration: Skin is not jaundiced.     Findings: No lesion or rash.  Neurological:     General: No focal deficit present.     Mental Status: She is alert and oriented to person, place, and time. Mental status is at baseline.  Psychiatric:        Mood and Affect: Mood normal.        Behavior: Behavior normal.        Thought Content: Thought content normal.        Judgment: Judgment normal.   ASSESSMENT & PLAN:  Assessment/Plan:  A 82 y.o. 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  Based upon her clinical breast exam today, the patient remains disease-free.  She knows to continue taking anastrozole 1 mg daily to complete  5 years of adjuvant endocrine therapy.  With respect to her osteoporosis, she knows to continue taking Fosamax on a weekly basis.  She also knows to take her daily allotment of calcium and vitamin D for bone health.  Clinically, the patient appears to be doing very well.  I will see her back in 6 months for a repeat clinical breast exam. Her annual mammogram will be scheduled before her next visit for her continued radiographic breast cancer surveillance. The patient understands all the plans discussed today and is in agreement with them.    Monaye Blackie Macarthur Critchley, MD

## 2022-03-04 ENCOUNTER — Inpatient Hospital Stay: Payer: Medicare PPO | Attending: Oncology | Admitting: Oncology

## 2022-03-04 VITALS — BP 136/62 | HR 75 | Temp 98.4°F | Resp 14 | Ht 63.0 in | Wt 145.1 lb

## 2022-03-04 DIAGNOSIS — C50412 Malignant neoplasm of upper-outer quadrant of left female breast: Secondary | ICD-10-CM | POA: Diagnosis not present

## 2022-03-04 DIAGNOSIS — Z170 Estrogen receptor positive status [ER+]: Secondary | ICD-10-CM | POA: Diagnosis not present

## 2022-04-13 DIAGNOSIS — H903 Sensorineural hearing loss, bilateral: Secondary | ICD-10-CM | POA: Diagnosis not present

## 2022-04-13 DIAGNOSIS — H9319 Tinnitus, unspecified ear: Secondary | ICD-10-CM | POA: Diagnosis not present

## 2022-04-13 DIAGNOSIS — J302 Other seasonal allergic rhinitis: Secondary | ICD-10-CM | POA: Diagnosis not present

## 2022-04-13 DIAGNOSIS — H61303 Acquired stenosis of external ear canal, unspecified, bilateral: Secondary | ICD-10-CM | POA: Diagnosis not present

## 2022-04-27 DIAGNOSIS — Z853 Personal history of malignant neoplasm of breast: Secondary | ICD-10-CM | POA: Diagnosis not present

## 2022-04-27 DIAGNOSIS — E785 Hyperlipidemia, unspecified: Secondary | ICD-10-CM | POA: Diagnosis not present

## 2022-04-27 DIAGNOSIS — I351 Nonrheumatic aortic (valve) insufficiency: Secondary | ICD-10-CM | POA: Diagnosis not present

## 2022-04-27 DIAGNOSIS — M810 Age-related osteoporosis without current pathological fracture: Secondary | ICD-10-CM | POA: Diagnosis not present

## 2022-04-27 DIAGNOSIS — E1122 Type 2 diabetes mellitus with diabetic chronic kidney disease: Secondary | ICD-10-CM | POA: Diagnosis not present

## 2022-04-27 DIAGNOSIS — E039 Hypothyroidism, unspecified: Secondary | ICD-10-CM | POA: Diagnosis not present

## 2022-04-27 DIAGNOSIS — N182 Chronic kidney disease, stage 2 (mild): Secondary | ICD-10-CM | POA: Diagnosis not present

## 2022-04-27 DIAGNOSIS — K219 Gastro-esophageal reflux disease without esophagitis: Secondary | ICD-10-CM | POA: Diagnosis not present

## 2022-04-27 DIAGNOSIS — I10 Essential (primary) hypertension: Secondary | ICD-10-CM | POA: Diagnosis not present

## 2022-05-06 DIAGNOSIS — N644 Mastodynia: Secondary | ICD-10-CM | POA: Diagnosis not present

## 2022-05-06 DIAGNOSIS — N6321 Unspecified lump in the left breast, upper outer quadrant: Secondary | ICD-10-CM | POA: Diagnosis not present

## 2022-05-06 DIAGNOSIS — R928 Other abnormal and inconclusive findings on diagnostic imaging of breast: Secondary | ICD-10-CM | POA: Diagnosis not present

## 2022-06-28 DIAGNOSIS — R300 Dysuria: Secondary | ICD-10-CM | POA: Diagnosis not present

## 2022-06-28 DIAGNOSIS — R319 Hematuria, unspecified: Secondary | ICD-10-CM | POA: Diagnosis not present

## 2022-07-23 DIAGNOSIS — Z1231 Encounter for screening mammogram for malignant neoplasm of breast: Secondary | ICD-10-CM | POA: Diagnosis not present

## 2022-07-28 DIAGNOSIS — Z170 Estrogen receptor positive status [ER+]: Secondary | ICD-10-CM | POA: Diagnosis not present

## 2022-07-28 DIAGNOSIS — C50412 Malignant neoplasm of upper-outer quadrant of left female breast: Secondary | ICD-10-CM | POA: Diagnosis not present

## 2022-08-31 DIAGNOSIS — N3281 Overactive bladder: Secondary | ICD-10-CM | POA: Diagnosis not present

## 2022-08-31 DIAGNOSIS — N390 Urinary tract infection, site not specified: Secondary | ICD-10-CM | POA: Diagnosis not present

## 2022-08-31 DIAGNOSIS — N952 Postmenopausal atrophic vaginitis: Secondary | ICD-10-CM | POA: Diagnosis not present

## 2022-09-01 DIAGNOSIS — Z9181 History of falling: Secondary | ICD-10-CM | POA: Diagnosis not present

## 2022-09-01 DIAGNOSIS — I10 Essential (primary) hypertension: Secondary | ICD-10-CM | POA: Diagnosis not present

## 2022-09-01 DIAGNOSIS — E039 Hypothyroidism, unspecified: Secondary | ICD-10-CM | POA: Diagnosis not present

## 2022-09-01 DIAGNOSIS — K219 Gastro-esophageal reflux disease without esophagitis: Secondary | ICD-10-CM | POA: Diagnosis not present

## 2022-09-01 DIAGNOSIS — I351 Nonrheumatic aortic (valve) insufficiency: Secondary | ICD-10-CM | POA: Diagnosis not present

## 2022-09-01 DIAGNOSIS — E1122 Type 2 diabetes mellitus with diabetic chronic kidney disease: Secondary | ICD-10-CM | POA: Diagnosis not present

## 2022-09-01 DIAGNOSIS — Z139 Encounter for screening, unspecified: Secondary | ICD-10-CM | POA: Diagnosis not present

## 2022-09-01 DIAGNOSIS — N182 Chronic kidney disease, stage 2 (mild): Secondary | ICD-10-CM | POA: Diagnosis not present

## 2022-09-01 DIAGNOSIS — E785 Hyperlipidemia, unspecified: Secondary | ICD-10-CM | POA: Diagnosis not present

## 2022-09-01 NOTE — Progress Notes (Unsigned)
[facility]  [address] [place],  [place]  [address] [phone]  Clinic Day:  09/02/2022  Referring physician: Paulina Fusi, MD  HISTORY OF PRESENT ILLNESS:  The patient is an 82 y.o. 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  She completed her adjuvant chemotherapy, which consisted of weekly paclitaxel/Herceptin, followed by year-long maintenance Herceptin.  She continues to take anastrozole for her adjuvant endocrine endocrine therapy.   She comes in today for routine follow-up.  Since her last visit, the patient has been doing well.  She denies having any particular changes in her breasts which concern her for disease recurrence.  Of note, her annual mammogram in June 2024 continued to show no evidence of disease recurrence.  PHYSICAL EXAM:  Blood pressure (!) 140/64, pulse 65, temperature 98.5 F (36.9 C), resp. rate 14, height 5\' 3"  (1.6 m), weight 141 lb 14.4 oz (64.4 kg), SpO2 96%. Wt Readings from Last 3 Encounters:  09/02/22 141 lb 14.4 oz (64.4 kg)  03/04/22 145 lb 1.6 oz (65.8 kg)  09/01/21 152 lb 9.6 oz (69.2 kg)   Body mass index is 25.14 kg/m. Performance status (ECOG): 1 Physical Exam Constitutional:      Appearance: Normal appearance.  HENT:     Mouth/Throat:     Pharynx: Oropharynx is clear. No oropharyngeal exudate.  Cardiovascular:     Rate and Rhythm: Normal rate and regular rhythm.     Heart sounds: No murmur heard.    No friction rub. No gallop.  Pulmonary:     Breath sounds: Normal breath sounds.  Chest:  Breasts:    Right: No swelling, bleeding, inverted nipple, mass, nipple discharge or skin change.     Left: No swelling, bleeding, inverted nipple, mass, nipple discharge or skin change.  Abdominal:     General: Bowel sounds are normal. There is no distension.     Palpations: Abdomen is soft. There is no mass.      Tenderness: There is no abdominal tenderness.  Musculoskeletal:        General: No tenderness.     Cervical back: Normal range of motion and neck supple.     Right lower leg: No edema.     Left lower leg: No edema.  Lymphadenopathy:     Cervical: No cervical adenopathy.     Right cervical: No superficial, deep or posterior cervical adenopathy.    Left cervical: No superficial, deep or posterior cervical adenopathy.     Upper Body:     Right upper body: No supraclavicular or axillary adenopathy.     Left upper body: No supraclavicular or axillary adenopathy.     Lower Body: No right inguinal adenopathy. No left inguinal adenopathy.  Skin:    Coloration: Skin is not jaundiced.     Findings: No lesion or rash.  Neurological:     General: No focal deficit present.     Mental Status: She is alert and oriented to person, place, and time. Mental status is at baseline.  Psychiatric:        Mood and Affect: Mood normal.        Behavior: Behavior normal.        Thought Content: Thought content normal.        Judgment: Judgment normal.    ASSESSMENT & PLAN:  Assessment/Plan:  An 82 y.o. female with stage IA (T1b N0  M0) hormone/her 2 Neu receptor positive breast cancer.  Based upon her clinical breast exam today and recent mammogram, the patient remains disease-free.  She knows to continue taking anastrozole to complete 5 years of adjuvant endocrine therapy.  With respect to her osteoporosis, she knows to continue taking Fosamax on a weekly basis.  She also knows to take her daily allotment of calcium and vitamin D for her bone health.  Clinically, the patient appears to be doing very well.  I will see her back in 6 months for a repeat clinical breast exam.  The patient understands all the plans discussed today and is in agreement with them.    Kye Silverstein Kirby Funk, MD

## 2022-09-02 ENCOUNTER — Inpatient Hospital Stay: Payer: Medicare PPO | Attending: Oncology | Admitting: Oncology

## 2022-09-02 ENCOUNTER — Telehealth: Payer: Self-pay | Admitting: Oncology

## 2022-09-02 VITALS — BP 140/64 | HR 65 | Temp 98.5°F | Resp 14 | Ht 63.0 in | Wt 141.9 lb

## 2022-09-02 DIAGNOSIS — Z170 Estrogen receptor positive status [ER+]: Secondary | ICD-10-CM | POA: Diagnosis not present

## 2022-09-02 DIAGNOSIS — C50412 Malignant neoplasm of upper-outer quadrant of left female breast: Secondary | ICD-10-CM

## 2022-09-02 NOTE — Telephone Encounter (Signed)
09/02/22 Next appt scheduled and confirmed with patient

## 2022-09-08 DIAGNOSIS — N390 Urinary tract infection, site not specified: Secondary | ICD-10-CM | POA: Diagnosis not present

## 2022-09-21 DIAGNOSIS — F330 Major depressive disorder, recurrent, mild: Secondary | ICD-10-CM | POA: Diagnosis not present

## 2022-09-21 DIAGNOSIS — N3281 Overactive bladder: Secondary | ICD-10-CM | POA: Diagnosis not present

## 2022-09-23 DIAGNOSIS — H353132 Nonexudative age-related macular degeneration, bilateral, intermediate dry stage: Secondary | ICD-10-CM | POA: Diagnosis not present

## 2022-11-06 DIAGNOSIS — Z23 Encounter for immunization: Secondary | ICD-10-CM | POA: Diagnosis not present

## 2022-11-06 DIAGNOSIS — N3281 Overactive bladder: Secondary | ICD-10-CM | POA: Diagnosis not present

## 2022-11-06 DIAGNOSIS — F330 Major depressive disorder, recurrent, mild: Secondary | ICD-10-CM | POA: Diagnosis not present

## 2022-12-22 DIAGNOSIS — Z9181 History of falling: Secondary | ICD-10-CM | POA: Diagnosis not present

## 2022-12-22 DIAGNOSIS — Z0000 Encounter for general adult medical examination without abnormal findings: Secondary | ICD-10-CM | POA: Diagnosis not present

## 2023-01-01 DIAGNOSIS — Z6823 Body mass index (BMI) 23.0-23.9, adult: Secondary | ICD-10-CM | POA: Diagnosis not present

## 2023-01-01 DIAGNOSIS — N182 Chronic kidney disease, stage 2 (mild): Secondary | ICD-10-CM | POA: Diagnosis not present

## 2023-01-01 DIAGNOSIS — F0150 Vascular dementia without behavioral disturbance: Secondary | ICD-10-CM | POA: Diagnosis not present

## 2023-01-01 DIAGNOSIS — I10 Essential (primary) hypertension: Secondary | ICD-10-CM | POA: Diagnosis not present

## 2023-01-01 DIAGNOSIS — E785 Hyperlipidemia, unspecified: Secondary | ICD-10-CM | POA: Diagnosis not present

## 2023-01-01 DIAGNOSIS — E1122 Type 2 diabetes mellitus with diabetic chronic kidney disease: Secondary | ICD-10-CM | POA: Diagnosis not present

## 2023-01-01 DIAGNOSIS — N3281 Overactive bladder: Secondary | ICD-10-CM | POA: Diagnosis not present

## 2023-01-01 DIAGNOSIS — B001 Herpesviral vesicular dermatitis: Secondary | ICD-10-CM | POA: Diagnosis not present

## 2023-01-01 DIAGNOSIS — F330 Major depressive disorder, recurrent, mild: Secondary | ICD-10-CM | POA: Diagnosis not present

## 2023-03-04 NOTE — Progress Notes (Signed)
[facility]  [address] [place],  [place]  [address] [phone]  Clinic Day:  03/05/2023  Referring physician: Paulina Fusi, MD  HISTORY OF PRESENT ILLNESS:  The patient is an 83 y.o. 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  She completed her adjuvant chemotherapy, which consisted of weekly paclitaxel/Herceptin, followed by year-long maintenance Herceptin.  She continues to take anastrozole for her adjuvant endocrine endocrine therapy.   She comes in today for routine follow-up.  Since her last visit, the patient has been doing well.  She denies having any particular changes in her breasts which concern her for disease recurrence.    PHYSICAL EXAM:  Blood pressure (!) 151/43, pulse 64, temperature 98.6 F (37 C), temperature source Oral, resp. rate 16, height 5\' 3"  (1.6 m), weight 141 lb 12.8 oz (64.3 kg), SpO2 98%. Wt Readings from Last 3 Encounters:  03/05/23 141 lb 12.8 oz (64.3 kg)  09/02/22 141 lb 14.4 oz (64.4 kg)  03/04/22 145 lb 1.6 oz (65.8 kg)   Body mass index is 25.12 kg/m. Performance status (ECOG): 1 Physical Exam Constitutional:      Appearance: Normal appearance.  HENT:     Mouth/Throat:     Pharynx: Oropharynx is clear. No oropharyngeal exudate.  Cardiovascular:     Rate and Rhythm: Normal rate and regular rhythm.     Heart sounds: No murmur heard.    No friction rub. No gallop.  Pulmonary:     Breath sounds: Normal breath sounds.  Chest:  Breasts:    Right: No swelling, bleeding, inverted nipple, mass, nipple discharge or skin change.     Left: No swelling, bleeding, inverted nipple, mass, nipple discharge or skin change.  Abdominal:     General: Bowel sounds are normal. There is no distension.     Palpations: Abdomen is soft. There is no mass.     Tenderness: There is no abdominal tenderness.  Musculoskeletal:         General: No tenderness.     Cervical back: Normal range of motion and neck supple.     Right lower leg: No edema.     Left lower leg: No edema.  Lymphadenopathy:     Cervical: No cervical adenopathy.     Right cervical: No superficial, deep or posterior cervical adenopathy.    Left cervical: No superficial, deep or posterior cervical adenopathy.     Upper Body:     Right upper body: No supraclavicular or axillary adenopathy.     Left upper body: No supraclavicular or axillary adenopathy.     Lower Body: No right inguinal adenopathy. No left inguinal adenopathy.  Skin:    Coloration: Skin is not jaundiced.     Findings: No lesion or rash.  Neurological:     General: No focal deficit present.     Mental Status: She is alert and oriented to person, place, and time. Mental status is at baseline.  Psychiatric:        Mood and Affect: Mood normal.        Behavior: Behavior normal.        Thought Content: Thought content normal.        Judgment: Judgment normal.    ASSESSMENT & PLAN:  Assessment/Plan:  An 83 y.o. 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  Based upon her  clinical breast exam today, the patient remains disease-free.  She knows to continue taking her anastrozole to complete 5 total years of adjuvant endocrine therapy.  With respect to her osteoporosis, she knows to continue taking Fosamax on a weekly basis.  She also knows to take her daily allotment of calcium and vitamin D for her bone health.  Clinically, the patient appears to be doing very well.  I will see her back in 6 months for a repeat clinical breast exam.  Her annual mammogram will be scheduled before her next visit for her continued radiographic breast cancer surveillance.  If her next mammogram and clinical breast exam both come back normal, I will begin spacing all future visits out to 1 year.  The patient understands all the plans discussed today and is in agreement with them.     Quin Mcpherson Kirby Funk, MD

## 2023-03-05 ENCOUNTER — Inpatient Hospital Stay: Payer: Medicare PPO | Attending: Oncology | Admitting: Oncology

## 2023-03-05 ENCOUNTER — Ambulatory Visit: Payer: Medicare PPO | Admitting: Oncology

## 2023-03-05 VITALS — BP 145/65 | HR 64 | Temp 98.6°F | Resp 16 | Ht 63.0 in | Wt 141.8 lb

## 2023-03-05 DIAGNOSIS — Z170 Estrogen receptor positive status [ER+]: Secondary | ICD-10-CM

## 2023-03-05 DIAGNOSIS — Z79811 Long term (current) use of aromatase inhibitors: Secondary | ICD-10-CM | POA: Diagnosis not present

## 2023-03-05 DIAGNOSIS — M810 Age-related osteoporosis without current pathological fracture: Secondary | ICD-10-CM | POA: Insufficient documentation

## 2023-03-05 DIAGNOSIS — C50912 Malignant neoplasm of unspecified site of left female breast: Secondary | ICD-10-CM | POA: Insufficient documentation

## 2023-03-05 DIAGNOSIS — C50412 Malignant neoplasm of upper-outer quadrant of left female breast: Secondary | ICD-10-CM

## 2023-04-05 DIAGNOSIS — E039 Hypothyroidism, unspecified: Secondary | ICD-10-CM | POA: Diagnosis not present

## 2023-04-05 DIAGNOSIS — N182 Chronic kidney disease, stage 2 (mild): Secondary | ICD-10-CM | POA: Diagnosis not present

## 2023-04-05 DIAGNOSIS — Z6825 Body mass index (BMI) 25.0-25.9, adult: Secondary | ICD-10-CM | POA: Diagnosis not present

## 2023-04-05 DIAGNOSIS — E785 Hyperlipidemia, unspecified: Secondary | ICD-10-CM | POA: Diagnosis not present

## 2023-04-05 DIAGNOSIS — B001 Herpesviral vesicular dermatitis: Secondary | ICD-10-CM | POA: Diagnosis not present

## 2023-04-05 DIAGNOSIS — I10 Essential (primary) hypertension: Secondary | ICD-10-CM | POA: Diagnosis not present

## 2023-04-05 DIAGNOSIS — E1122 Type 2 diabetes mellitus with diabetic chronic kidney disease: Secondary | ICD-10-CM | POA: Diagnosis not present

## 2023-04-05 DIAGNOSIS — M8589 Other specified disorders of bone density and structure, multiple sites: Secondary | ICD-10-CM | POA: Diagnosis not present

## 2023-04-05 DIAGNOSIS — N3281 Overactive bladder: Secondary | ICD-10-CM | POA: Diagnosis not present

## 2023-04-05 DIAGNOSIS — F330 Major depressive disorder, recurrent, mild: Secondary | ICD-10-CM | POA: Diagnosis not present

## 2023-07-04 DIAGNOSIS — R0981 Nasal congestion: Secondary | ICD-10-CM | POA: Diagnosis not present

## 2023-07-04 DIAGNOSIS — L0100 Impetigo, unspecified: Secondary | ICD-10-CM | POA: Diagnosis not present

## 2023-07-09 DIAGNOSIS — E1122 Type 2 diabetes mellitus with diabetic chronic kidney disease: Secondary | ICD-10-CM | POA: Diagnosis not present

## 2023-07-09 DIAGNOSIS — F330 Major depressive disorder, recurrent, mild: Secondary | ICD-10-CM | POA: Diagnosis not present

## 2023-07-09 DIAGNOSIS — E039 Hypothyroidism, unspecified: Secondary | ICD-10-CM | POA: Diagnosis not present

## 2023-07-09 DIAGNOSIS — N182 Chronic kidney disease, stage 2 (mild): Secondary | ICD-10-CM | POA: Diagnosis not present

## 2023-07-09 DIAGNOSIS — F0150 Vascular dementia without behavioral disturbance: Secondary | ICD-10-CM | POA: Diagnosis not present

## 2023-07-09 DIAGNOSIS — R252 Cramp and spasm: Secondary | ICD-10-CM | POA: Diagnosis not present

## 2023-07-09 DIAGNOSIS — N3281 Overactive bladder: Secondary | ICD-10-CM | POA: Diagnosis not present

## 2023-07-09 DIAGNOSIS — I10 Essential (primary) hypertension: Secondary | ICD-10-CM | POA: Diagnosis not present

## 2023-07-09 DIAGNOSIS — B001 Herpesviral vesicular dermatitis: Secondary | ICD-10-CM | POA: Diagnosis not present

## 2023-07-09 DIAGNOSIS — E559 Vitamin D deficiency, unspecified: Secondary | ICD-10-CM | POA: Diagnosis not present

## 2023-07-09 DIAGNOSIS — E785 Hyperlipidemia, unspecified: Secondary | ICD-10-CM | POA: Diagnosis not present

## 2023-07-28 DIAGNOSIS — Z1231 Encounter for screening mammogram for malignant neoplasm of breast: Secondary | ICD-10-CM | POA: Diagnosis not present

## 2023-07-28 DIAGNOSIS — M8589 Other specified disorders of bone density and structure, multiple sites: Secondary | ICD-10-CM | POA: Diagnosis not present

## 2023-07-28 LAB — HM MAMMOGRAPHY

## 2023-07-30 DIAGNOSIS — M8588 Other specified disorders of bone density and structure, other site: Secondary | ICD-10-CM | POA: Diagnosis not present

## 2023-07-30 DIAGNOSIS — M810 Age-related osteoporosis without current pathological fracture: Secondary | ICD-10-CM | POA: Diagnosis not present

## 2023-07-30 DIAGNOSIS — Z13820 Encounter for screening for osteoporosis: Secondary | ICD-10-CM | POA: Diagnosis not present

## 2023-07-30 LAB — HM DEXA SCAN

## 2023-08-03 DIAGNOSIS — C50412 Malignant neoplasm of upper-outer quadrant of left female breast: Secondary | ICD-10-CM | POA: Diagnosis not present

## 2023-08-03 DIAGNOSIS — Z170 Estrogen receptor positive status [ER+]: Secondary | ICD-10-CM | POA: Diagnosis not present

## 2023-08-22 NOTE — Progress Notes (Unsigned)
 [facility]  [address] [place],  [place]  [address] [phone]  Clinic Day:  08/23/2023  Referring physician: Keren Vicenta BRAVO, MD  HISTORY OF PRESENT ILLNESS:  The patient is an 83 y.o. 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  She completed her adjuvant chemotherapy, which consisted of weekly paclitaxel/Herceptin, followed by year-long maintenance Herceptin.  She continues to take anastrozole  for her adjuvant endocrine endocrine therapy.   She comes in today for routine follow-up.  Since her last visit, the patient has been doing well.  She denies having any particular changes in her breasts which concern her for disease recurrence.  Of note, her annual mammogram in June 2025 showed no evidence of disease recurrence.  Her bone density study done on the same day showed persistent osteoporosis.  PHYSICAL EXAM:  Blood pressure (!) 174/59, pulse 62, temperature 98.5 F (36.9 C), temperature source Oral, resp. rate 14, height 5' 3 (1.6 m), weight 146 lb 9.6 oz (66.5 kg), SpO2 100%. Wt Readings from Last 3 Encounters:  08/23/23 146 lb 9.6 oz (66.5 kg)  03/05/23 141 lb 12.8 oz (64.3 kg)  09/02/22 141 lb 14.4 oz (64.4 kg)   Body mass index is 25.97 kg/m. Performance status (ECOG): 1 Physical Exam Constitutional:      Appearance: Normal appearance.  HENT:     Mouth/Throat:     Pharynx: Oropharynx is clear. No oropharyngeal exudate.  Cardiovascular:     Rate and Rhythm: Normal rate and regular rhythm.     Heart sounds: No murmur heard.    No friction rub. No gallop.  Pulmonary:     Breath sounds: Normal breath sounds.  Chest:  Breasts:    Right: No swelling, bleeding, inverted nipple, mass, nipple discharge or skin change.     Left: No swelling, bleeding, inverted nipple, mass, nipple discharge or skin change.  Abdominal:     General: Bowel sounds  are normal. There is no distension.     Palpations: Abdomen is soft. There is no mass.     Tenderness: There is no abdominal tenderness.  Musculoskeletal:        General: No tenderness.     Cervical back: Normal range of motion and neck supple.     Right lower leg: No edema.     Left lower leg: No edema.  Lymphadenopathy:     Cervical: No cervical adenopathy.     Right cervical: No superficial, deep or posterior cervical adenopathy.    Left cervical: No superficial, deep or posterior cervical adenopathy.     Upper Body:     Right upper body: No supraclavicular or axillary adenopathy.     Left upper body: No supraclavicular or axillary adenopathy.     Lower Body: No right inguinal adenopathy. No left inguinal adenopathy.  Skin:    Coloration: Skin is not jaundiced.     Findings: No lesion or rash.  Neurological:     General: No focal deficit present.     Mental Status: She is alert and oriented to person, place, and time. Mental status is at baseline.  Psychiatric:        Mood and Affect: Mood normal.        Behavior: Behavior normal.        Thought Content: Thought content normal.        Judgment: Judgment normal.    ASSESSMENT &  PLAN:  Assessment/Plan:  An 83 y.o. 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  Based upon her clinical breast exam today and recent mammogram, the patient remains disease-free.  She knows to continue taking her anastrozole  to complete 5 total years of adjuvant endocrine therapy, which she is near finishing.  With respect to her osteoporosis, she knows to continue taking Fosamax on a weekly basis.  She also knows to take her daily allotment of calcium and vitamin D for her bone health.  My hope is that her bone health numbers will improve over time as she is about to finish her anastrozole .  Clinically, the patient appears to be doing very well.  Based upon this, I do feel comfortable spacing all future visits out to  once per year.  Her annual mammogram has already been scheduled before her next visit for her continued radiographic breast cancer surveillance.  The patient understands all the plans discussed today and is in agreement with them.    Charisma Charlot DELENA Kerns, MD

## 2023-08-23 ENCOUNTER — Other Ambulatory Visit: Payer: Self-pay

## 2023-08-23 ENCOUNTER — Telehealth: Payer: Self-pay | Admitting: Oncology

## 2023-08-23 ENCOUNTER — Inpatient Hospital Stay: Attending: Oncology | Admitting: Oncology

## 2023-08-23 VITALS — BP 174/59 | HR 62 | Temp 98.5°F | Resp 14 | Ht 63.0 in | Wt 146.6 lb

## 2023-08-23 DIAGNOSIS — Z170 Estrogen receptor positive status [ER+]: Secondary | ICD-10-CM | POA: Diagnosis not present

## 2023-08-23 DIAGNOSIS — C50912 Malignant neoplasm of unspecified site of left female breast: Secondary | ICD-10-CM | POA: Diagnosis not present

## 2023-08-23 DIAGNOSIS — M810 Age-related osteoporosis without current pathological fracture: Secondary | ICD-10-CM | POA: Insufficient documentation

## 2023-08-23 DIAGNOSIS — Z1732 Human epidermal growth factor receptor 2 negative status: Secondary | ICD-10-CM | POA: Diagnosis not present

## 2023-08-23 DIAGNOSIS — C50412 Malignant neoplasm of upper-outer quadrant of left female breast: Secondary | ICD-10-CM

## 2023-08-23 NOTE — Telephone Encounter (Signed)
 Patient has been scheduled for follow-up visit per 08/23/23 LOS.  Pt given an appt calendar with date and time.

## 2023-09-02 ENCOUNTER — Ambulatory Visit: Payer: Medicare PPO | Admitting: Oncology

## 2023-09-24 DIAGNOSIS — L299 Pruritus, unspecified: Secondary | ICD-10-CM | POA: Diagnosis not present

## 2023-09-24 DIAGNOSIS — L02414 Cutaneous abscess of left upper limb: Secondary | ICD-10-CM | POA: Diagnosis not present

## 2023-09-24 DIAGNOSIS — L209 Atopic dermatitis, unspecified: Secondary | ICD-10-CM | POA: Diagnosis not present

## 2023-09-24 DIAGNOSIS — D485 Neoplasm of uncertain behavior of skin: Secondary | ICD-10-CM | POA: Diagnosis not present

## 2023-10-01 DIAGNOSIS — H524 Presbyopia: Secondary | ICD-10-CM | POA: Diagnosis not present

## 2023-10-01 DIAGNOSIS — H353132 Nonexudative age-related macular degeneration, bilateral, intermediate dry stage: Secondary | ICD-10-CM | POA: Diagnosis not present

## 2023-10-22 DIAGNOSIS — E785 Hyperlipidemia, unspecified: Secondary | ICD-10-CM | POA: Diagnosis not present

## 2023-10-22 DIAGNOSIS — B001 Herpesviral vesicular dermatitis: Secondary | ICD-10-CM | POA: Diagnosis not present

## 2023-10-22 DIAGNOSIS — F330 Major depressive disorder, recurrent, mild: Secondary | ICD-10-CM | POA: Diagnosis not present

## 2023-10-22 DIAGNOSIS — N3281 Overactive bladder: Secondary | ICD-10-CM | POA: Diagnosis not present

## 2023-10-22 DIAGNOSIS — E1122 Type 2 diabetes mellitus with diabetic chronic kidney disease: Secondary | ICD-10-CM | POA: Diagnosis not present

## 2023-10-22 DIAGNOSIS — N182 Chronic kidney disease, stage 2 (mild): Secondary | ICD-10-CM | POA: Diagnosis not present

## 2023-10-22 DIAGNOSIS — I10 Essential (primary) hypertension: Secondary | ICD-10-CM | POA: Diagnosis not present

## 2023-10-22 DIAGNOSIS — E039 Hypothyroidism, unspecified: Secondary | ICD-10-CM | POA: Diagnosis not present

## 2023-10-22 DIAGNOSIS — F0150 Vascular dementia without behavioral disturbance: Secondary | ICD-10-CM | POA: Diagnosis not present

## 2023-11-03 DIAGNOSIS — R2232 Localized swelling, mass and lump, left upper limb: Secondary | ICD-10-CM | POA: Diagnosis not present

## 2023-11-16 DIAGNOSIS — M461 Sacroiliitis, not elsewhere classified: Secondary | ICD-10-CM | POA: Diagnosis not present

## 2023-11-16 DIAGNOSIS — M4316 Spondylolisthesis, lumbar region: Secondary | ICD-10-CM | POA: Diagnosis not present

## 2023-12-03 DIAGNOSIS — M810 Age-related osteoporosis without current pathological fracture: Secondary | ICD-10-CM | POA: Diagnosis not present

## 2023-12-03 DIAGNOSIS — Z23 Encounter for immunization: Secondary | ICD-10-CM | POA: Diagnosis not present

## 2023-12-16 DIAGNOSIS — M8589 Other specified disorders of bone density and structure, multiple sites: Secondary | ICD-10-CM | POA: Diagnosis not present

## 2023-12-31 DIAGNOSIS — M25551 Pain in right hip: Secondary | ICD-10-CM | POA: Diagnosis not present

## 2024-01-10 DIAGNOSIS — M47816 Spondylosis without myelopathy or radiculopathy, lumbar region: Secondary | ICD-10-CM | POA: Diagnosis not present

## 2024-01-10 DIAGNOSIS — M51369 Other intervertebral disc degeneration, lumbar region without mention of lumbar back pain or lower extremity pain: Secondary | ICD-10-CM | POA: Diagnosis not present

## 2024-01-13 NOTE — Progress Notes (Signed)
  Rapid Diagnostic Service for Malignancies [facility]  Diagnostic Nurse Navigator Telephone Note:  Initial Encounter  01/13/24  Patient Name:  Kimberly Yu Patient MRN:  [id number] Patient DOB:  Aug 29, 1940   I contacted Kimberly Yu regarding their hematology/oncology referral from [facility] PA-C for purpose of evaluation and work up of bone lesion.  I introduced myself by phone and confirmed the patient's identity using two identifiers; introduced myself, my role, and reason for contact.  Kimberly Yu was aware of her referral and reason.  Information on reason for referral was not necessary.  The Rapid Diagnostic Service for Malignancy (RDS) was explained to the patient, including the intent being to complete a diagnostic/prognostic plan of care for the patient's findings concerning for cancer after a face-to-face visit and physical assessment in clinic by the [facility] APP and/or the supervising physician.  Pertinent questions were addressed to the patient's satisfaction at this time.    The patient is aware of RDS appointment details and of the intent of the visit(s).  Care coordination during this diagnostic process will be performed by the RDS's Diagnostic Navigator.  We look forward to meeting Kimberly Yu to complete her diagnostic work-up and coordination of subsequent care post-diagnosis as appropriate.  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

## 2024-01-14 ENCOUNTER — Inpatient Hospital Stay: Admitting: Hematology and Oncology

## 2024-01-14 ENCOUNTER — Other Ambulatory Visit: Payer: Self-pay | Admitting: Hematology and Oncology

## 2024-01-14 ENCOUNTER — Inpatient Hospital Stay

## 2024-01-14 ENCOUNTER — Inpatient Hospital Stay: Attending: Hematology and Oncology | Admitting: Hematology and Oncology

## 2024-01-14 ENCOUNTER — Other Ambulatory Visit: Payer: Self-pay

## 2024-01-14 VITALS — BP 117/48 | HR 56 | Temp 98.1°F | Resp 16 | Ht 63.0 in | Wt 150.3 lb

## 2024-01-14 DIAGNOSIS — C50412 Malignant neoplasm of upper-outer quadrant of left female breast: Secondary | ICD-10-CM

## 2024-01-14 DIAGNOSIS — C50912 Malignant neoplasm of unspecified site of left female breast: Secondary | ICD-10-CM | POA: Insufficient documentation

## 2024-01-14 DIAGNOSIS — R937 Abnormal findings on diagnostic imaging of other parts of musculoskeletal system: Secondary | ICD-10-CM | POA: Diagnosis not present

## 2024-01-14 DIAGNOSIS — M810 Age-related osteoporosis without current pathological fracture: Secondary | ICD-10-CM | POA: Insufficient documentation

## 2024-01-14 DIAGNOSIS — Z79811 Long term (current) use of aromatase inhibitors: Secondary | ICD-10-CM | POA: Diagnosis not present

## 2024-01-14 DIAGNOSIS — Z170 Estrogen receptor positive status [ER+]: Secondary | ICD-10-CM | POA: Insufficient documentation

## 2024-01-14 LAB — CBC WITH DIFFERENTIAL (CANCER CENTER ONLY)
Abs Immature Granulocytes: 0.02 K/uL (ref 0.00–0.07)
Basophils Absolute: 0.1 K/uL (ref 0.0–0.1)
Basophils Relative: 1 %
Eosinophils Absolute: 0.1 K/uL (ref 0.0–0.5)
Eosinophils Relative: 2 %
HCT: 36.8 % (ref 36.0–46.0)
Hemoglobin: 11.9 g/dL — ABNORMAL LOW (ref 12.0–15.0)
Immature Granulocytes: 0 %
Lymphocytes Relative: 24 %
Lymphs Abs: 1.3 K/uL (ref 0.7–4.0)
MCH: 31.4 pg (ref 26.0–34.0)
MCHC: 32.3 g/dL (ref 30.0–36.0)
MCV: 97.1 fL (ref 80.0–100.0)
Monocytes Absolute: 0.5 K/uL (ref 0.1–1.0)
Monocytes Relative: 8 %
Neutro Abs: 3.6 K/uL (ref 1.7–7.7)
Neutrophils Relative %: 65 %
Platelet Count: 327 K/uL (ref 150–400)
RBC: 3.79 MIL/uL — ABNORMAL LOW (ref 3.87–5.11)
RDW: 12.9 % (ref 11.5–15.5)
WBC Count: 5.5 K/uL (ref 4.0–10.5)
nRBC: 0 % (ref 0.0–0.2)

## 2024-01-14 LAB — CMP (CANCER CENTER ONLY)
ALT: 10 U/L (ref 0–44)
AST: 19 U/L (ref 15–41)
Albumin: 3.8 g/dL (ref 3.5–5.0)
Alkaline Phosphatase: 180 U/L — ABNORMAL HIGH (ref 38–126)
Anion gap: 8 (ref 5–15)
BUN: 24 mg/dL — ABNORMAL HIGH (ref 8–23)
CO2: 27 mmol/L (ref 22–32)
Calcium: 8.7 mg/dL — ABNORMAL LOW (ref 8.9–10.3)
Chloride: 103 mmol/L (ref 98–111)
Creatinine: 0.79 mg/dL (ref 0.44–1.00)
GFR, Estimated: >60 mL/min (ref >60)
Glucose, Bld: 141 mg/dL — ABNORMAL HIGH (ref 70–99)
Potassium: 4.5 mmol/L (ref 3.5–5.1)
Sodium: 138 mmol/L (ref 135–145)
Total Bilirubin: 0.6 mg/dL (ref 0.0–1.2)
Total Protein: 6.4 g/dL — ABNORMAL LOW (ref 6.5–8.1)

## 2024-01-14 MED ORDER — OXYCODONE HCL 5 MG PO TABS: 5.0000 mg | ORAL_TABLET | ORAL | 0 refills | Status: AC | PRN

## 2024-01-14 NOTE — Progress Notes (Signed)
 Initial meeting with patient in clinic Rapid Diagnostic Services  Patient presented to clinic, with Husband, for her scheduled appointment with NP Melissa. I introduced myself and provided them with my direct contact information. Patient and husband were both encouraged to call me with any questions/concerns they may have.  Selinda An, RN Oncology Nurse Navigator, [facility]  01/14/2024 10:49 AM

## 2024-01-14 NOTE — Progress Notes (Signed)
 [facility]  [address] [place],  [place]  [address] [phone]  Clinic Day:  08/23/2023  Referring physician: Keren Vicenta BRAVO, MD  HISTORY OF PRESENT ILLNESS:  The patient is an 83 y.o. 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  She completed her adjuvant chemotherapy, which consisted of weekly paclitaxel/Herceptin, followed by year-long maintenance Herceptin.  She continues to take anastrozole  for her adjuvant endocrine endocrine therapy.   She comes in today for routine follow-up.  Since her last visit, the patient has been doing well.  She denies having any particular changes in her breasts which concern her for disease recurrence.  Of note, her annual mammogram in June 2025 showed no evidence of disease recurrence.  Her bone density study done on the same day showed persistent osteoporosis.  She recently presented to her PCP for worsening hip pain. This started on the left and then moved to the right and has persisted. 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   PHYSICAL EXAM:  There were no vitals taken for this visit. Wt Readings from Last 3 Encounters:  08/23/23 146 lb 9.6 oz (66.5 kg)  03/05/23 141 lb 12.8 oz (64.3 kg)  09/02/22 141 lb 14.4 oz (64.4 kg)   There is no height or weight on file to calculate BMI. Performance status (ECOG): 1 Physical Exam Constitutional:      Appearance: Normal appearance.  HENT:     Mouth/Throat:     Pharynx: Oropharynx is clear. No oropharyngeal exudate.  Cardiovascular:     Rate and Rhythm: Normal rate and regular rhythm.     Heart sounds: No murmur heard.    No friction rub. No gallop.   Pulmonary:     Breath sounds: Normal breath sounds.  Chest:  Breasts:    Right: No swelling, bleeding, inverted nipple, mass, nipple discharge or skin change.     Left: No swelling, bleeding, inverted nipple, mass, nipple discharge or skin change.  Abdominal:     General: Bowel sounds are normal. There is no distension.     Palpations: Abdomen is soft. There is no mass.     Tenderness: There is no abdominal tenderness.  Musculoskeletal:        General: No tenderness.     Cervical back: Normal range of motion and neck supple.     Right lower leg: No edema.     Left lower leg: No edema.  Lymphadenopathy:     Cervical: No cervical adenopathy.     Right cervical: No superficial, deep or posterior cervical adenopathy.    Left cervical: No superficial, deep or posterior cervical adenopathy.     Upper Body:     Right upper body: No supraclavicular or axillary adenopathy.     Left upper body: No supraclavicular or axillary adenopathy.     Lower Body: No right inguinal adenopathy. No left inguinal adenopathy.  Skin:    Coloration: Skin is not jaundiced.     Findings: No lesion or rash.  Neurological:     General: No focal deficit present.     Mental Status: She is alert and oriented to person, place, and time. Mental status is at baseline.  Psychiatric:        Mood and Affect: Mood normal.        Behavior: Behavior normal.        Thought Content: Thought content normal.        Judgment: Judgment normal.    ASSESSMENT & PLAN:  Assessment/Plan:  An 83 y.o. 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  Based upon her clinical breast exam today and recent mammogram, the patient remains disease-free.  She knows to continue taking her anastrozole  to complete 5 total years of adjuvant endocrine therapy, which she is near finishing.  With respect to her osteoporosis, she knows to continue taking Fosamax on a weekly basis.  She also knows to take her daily  allotment of calcium and vitamin D for her bone health.    She recently presented to her PCP for worsening hip pain. This started on the left and then moved to the right and has persisted. 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 We will reach out to radiology for an opinion of next step imaging as CT spine would presumably show less specificity than MRI. We could possible pursue PET CT if indicated. Breast exam today is unremarkable and she denies any other symptoms except for pain. She notes the pain is worse when ambulating. She was prescribed Tramadol which has not been helpful. I will send in low dose oxycodone  and we will plan for follow up with Dr. Ezzard once next step imaging has been obtained. Message has been left for radiology department at [facility] for assistance with interpreting recent MRI.   Kimberly Bach, FNP- BC

## 2024-01-26 ENCOUNTER — Other Ambulatory Visit: Payer: Self-pay | Admitting: Hematology and Oncology

## 2024-01-26 DIAGNOSIS — R937 Abnormal findings on diagnostic imaging of other parts of musculoskeletal system: Secondary | ICD-10-CM

## 2024-02-17 ENCOUNTER — Ambulatory Visit (HOSPITAL_BASED_OUTPATIENT_CLINIC_OR_DEPARTMENT_OTHER)
Admission: RE | Admit: 2024-02-17 | Discharge: 2024-02-17 | Disposition: A | Discharge status: Home / Self Care | Source: Ambulatory Visit | Attending: Hematology and Oncology | Admitting: Hematology and Oncology

## 2024-02-17 DIAGNOSIS — R937 Abnormal findings on diagnostic imaging of other parts of musculoskeletal system: Secondary | ICD-10-CM

## 2024-02-17 MED ORDER — GADOBUTROL 1 MMOL/ML IV SOLN
7.0000 mL | Freq: Once | INTRAVENOUS | Status: AC | PRN
  Administered 2024-02-17: 7 mL via INTRAVENOUS

## 2024-02-25 ENCOUNTER — Telehealth: Payer: Self-pay

## 2024-02-25 NOTE — Telephone Encounter (Signed)
 Patient is wanting results of MRI please advise.

## 2024-03-10 NOTE — Progress Notes (Signed)
 [facility]  New Patient Visit   ASSESSMENT/PLAN  Kimberly Yu is a 84 y.o. old female who presents for consultation for low back pain  Lumbar radiculopathy, Right-sided - Chronic, worsening. 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 Suspect acute herniation of disc at this level which has began to improve. Symptoms concordant with MRI. Continue current medication regimen including OTC analgesics, HEP. Could consider ESI at L2/3 level if workup negative for malignancy. Gabapentin not effective. Oncology following for further imaging. No AC.   Sacral insufficiency fracture - Followed by Raford Beers already with conservative management. Prescriptions orders for Celebrex and robaxin were provided for improved pain control.   L1 lesion of vertebral body - Patient has history of breast cancer, being treated with anastrozole  currently.  She is followed by oncology.  Lumbar spine initially showed L1 lesion which was concerning for possible mets vs other etiology. We have requested that imaging.  Follow-up pelvic MRI with contrast showed sacral insufficiency fractures but did not show L1 body. Oncology is following and has requested what imaging would be most pertinent for the follow-up for the L1 lesion.  They are still deciding on what imaging to get next, although a PET scan was mentioned. Would like to get further clarification about L1 lesion from oncology prior to any lumbar interventions. It was recommended that the patient follow-up with oncology in a timely manner for further evaluation.   Controlled Substance Monitoring: [place] was [place] and is consistent with current patient reported medications and prior physicians/providers.   Follow up in about 2 months (around 05/08/2024) for Follow up for evaluation. Advised patient to return sooner if needed and/or to call with any questions or concerns in the  interim.  Thank you kindly for your referral and for allowing me to be a part of your patient's care. Please do not hesitate to contact me with questions.  Jonathan LeCrone, MD Interventional Pain Medicine  Physical Medicine and Rehabilitation  [facility]   Subjective   Patient was referred to [facility] by [facility]ine [facility], [facility]  Chief Complaint:  Chief Complaint  Patient presents with   Hip Pain   Back Pain   Leg Pain    History of Present Illness: Kimberly Yu is a 84 y.o. old female who presents to the clinic for low back, bilateral hip and bilateral leg pain.   Patient presents with: spouse, Bertrum  Pain is primarily over the right buttock and wrapping around to groin and down the anterior leg. Worse with weightbearing. She did report weakness in right hip and had to lift hip manually with upper extremity to get in the car.  She reports weakness is much improved and feels approximately equal to the left on exam. There is pain with hip extension which I suspect is due to loading fracture. Oncology is currently working to get the creatinine the imaging necessary to workup L1 lesion.  We discussed we cannot do procedures until this etiology has been identified.  The pain began October 2025  Inciting event: no known injury The most significant area of pain is right hip.  The pain does radiate down bilateral legs into her ankles.  The pain is described as stabbing and occassional. The pain is made worse with climbing stairs, getting in/out of a seat, and walking. The pain is better with rest, heat The patient's pain score is rated as 6 out  of 10.  Patient denies fevers, chills, new or worsening bowel/bladder incontinence, saddle anesthesia, and/or focal neurological weakness/sensory changes. Does state at onset she wasn't able to lift her leg and had to use a walker or cane but has improved since.   Previous/Failed Pain  Course/Interventions:  Previously seen pain medicine or spine surgeon: Yes- Dr Renato- 20 years agoNone  PT/HEP: No PT in recent past             Medications utilized:  - Opioids: Oxycodone -Acetaminophen  (Percocet) and Tramadol (Ultram) - NSAIDs: Ibuprofen (Motrin/Advil) and Diclofenac Tablets (Voltaren/Cambria) - Muscle Relaxants: none - Anti-Depressants: none - Membrane Stabilizers: Gabapentin (Neurontin)  - Others: Tylenol   Previously tried interventions:  Cervical Medial Branch Block/Radiofrequency Ablation - 20 years ago  Relevant Spine/Joint Surgery:  - None  Anticoagulants: None  REVIEW OF SYSTEMS:  See HPI  HEALTH STATUS:  Current Medications:  Current Home Medications  Medication Sig Last Dose  acetaminophen  (TYLENOL ) 325 mg tablet Take two tablets (650 mg dose) by mouth every 6 (six) hours as needed.   amLODIPine besylate (NORVASC) 10 mg tablet Take one tablet (10 mg dose) by mouth daily.   celecoxib (CELEBREX) 100 mg capsule Take one capsule (100 mg dose) by mouth 2 (two) times daily.   donepezil HCl (ARICEPT) 10 mg tablet Take one tablet (10 mg dose) by mouth at bedtime.   fexofenadine (ALLEGRA) 180 mg tablet Take one tablet (180 mg dose) by mouth daily.   gabapentin (NEURONTIN) 300 mg capsule Take one capsule (300 mg dose) by mouth at bedtime. Patient not taking: Reported on 03/10/2024   levothyroxine sodium (SYNTHROID,LEVOTHROID,LOVOXYL) 50 mcg tablet Take one tablet (50 mcg dose) by mouth daily.   meclizine HCl (ANTIVERT) 25 mg tablet Take one tablet (25 mg dose) by mouth 3 (three) times a day as needed for Dizziness.   methocarbamol (ROBAXIN) 500 mg tablet Take one tablet (500 mg dose) by mouth 3 (three) times a day as needed. Start with 1 tab at night as needed x 5 days then increase to three times a day as needed and as tolerated.   metoprolol succinate (TOPROL-XL) 25 mg 24 hr tablet Take one tablet (25 mg dose) by mouth daily.   MYRBETRIQ 50 MG 24 hr  tablet Take one tablet (50 mg dose) by mouth daily.   omeprazole (PRILOSEC) 20 mg capsule Take two capsules (40 mg dose) by mouth daily.   pravastatin sodium (PRAVACHOL) 40 mg tablet Take one tablet (40 mg dose) by mouth daily.   PROLIA  60 mg/mL injection sixty mg once.   ramipril (ALTACE) 10 MG capsule Take two capsules (20 mg dose) by mouth daily.       Problem List:  Problem list reviewed  Allergies: Allergies[1]  Past Medical History: Past Medical History:  Diagnosis Date   Breast cancer (*)    Cancer (*)    Dementia (*)    Hyperlipidemia    Hypertension     Past Surgical History: Past Surgical History:  Procedure Laterality Date   Cataract extraction     Gallbladder surgery     Mastectomy w/ nodes partial Left     Social History: Short Social History[2]  Family History: Family History[3]  Objective   Physical Exam:  Vitals:   03/10/24 0817  BP: 122/62  BP Location: Right Upper Arm  Patient Position: Sitting  Pulse: 63  SpO2: 99%  Weight: 151 lb 6.4 oz (68.7 kg)  Height: 5' 3 (1.6 m)   Body mass  index is 26.82 kg/m.   Physical Exam   Constitutional: Alert, no acute distress HEENT: Normocephalic Respiratory: Regular respiratory rate, non-labored breathing Cardiovascular: No focal peripheral edema in lower extremities Musculoskeletal: Thoracolumbar ROM wnl. TTP over lumbar paraspinal muscles. No central bony TTP, stepoff, or deformity appreciated. Positive SLR on right and NOT left. Positive facet loading on right and left. TTP over right and not left left SIJ.  Abdomen: Flat, non-distended Neurologic: No focal sensory deficits. LE strength 5/5 bilaterally. Ambulates with cane/antalgic Psychiatric: Mood normal. Affect appropriate    Imaging and Diagnostic Studies:   I personally reviewed all applicable diagnostic imaging pertinent to this visit. Relevant diagnostic reports and/or my personal review of imaging or other diagnostic studies  listed below:    Relevant Labs Reviewed:  No results found for: HGBA1C, CHOL, HDL, LDL, TRIG, WBC, HGB, PLT, CREATININE, BUN, NA, K, CL, CO2, INR, TSH, ALT, AST, GGT, ALKPHOS, BILITOT   Controlled Substance Monitoring:   [employer] fires based based on the following guidelines:     OPIOID RISK TOOL  More data may exist      03/10/2024  Opioid Risk Tool  Family history alcohol  1*  Family history illegal drugs 0  Family history rx drugs 0  Personal history alcohol 0  Personal history illegal drugs 0  Personal history rx drugs 0  Age between 16-45 years 0  History of preadolescent sexual abuse 0  ADD, OCD, bipolar, schizophrenia 0  Depression 1*  Scoring Total 2  Interpretation Low risk for opioid abuse    Note: The following provider note was dictated using computer voice recognition software. There may be inadvertent spelling on misrepresentation of words within this provider note.        [1] No Known Allergies [2] Social History Tobacco Use   Smoking status: Never   Smokeless tobacco: Never  Vaping Use   Vaping status: Never Used  [3] Family History Problem Relation Name Age of Onset   Cancer Father     Diabetes Father     Heart attack Sister     Heart disease Mother    *Some images could not be shown.

## 2024-03-20 ENCOUNTER — Inpatient Hospital Stay: Attending: Hematology and Oncology | Admitting: Oncology

## 2024-08-23 ENCOUNTER — Ambulatory Visit: Admitting: Oncology
